# Patient Record
Sex: Male | Born: 1965 | Race: Black or African American | Hispanic: No | Marital: Married | Smoking: Former smoker
Health system: Southern US, Community
[De-identification: ages and names within clinical notes are randomized; demographics above are authoritative.]

## PROBLEM LIST (undated history)

## (undated) DIAGNOSIS — T7840XA Allergy, unspecified, initial encounter: Secondary | ICD-10-CM

## (undated) HISTORY — DX: Allergy, unspecified, initial encounter: T78.40XA

---

## 2017-06-05 ENCOUNTER — Emergency Department (HOSPITAL_COMMUNITY): Payer: Managed Care, Other (non HMO)

## 2017-06-05 ENCOUNTER — Encounter (HOSPITAL_COMMUNITY): Payer: Self-pay

## 2017-06-05 ENCOUNTER — Emergency Department (HOSPITAL_COMMUNITY)
Admission: EM | Admit: 2017-06-05 | Discharge: 2017-06-05 | Disposition: A | Discharge status: Home / Self Care | Payer: Managed Care, Other (non HMO) | Attending: Emergency Medicine | Admitting: Emergency Medicine

## 2017-06-05 DIAGNOSIS — F17210 Nicotine dependence, cigarettes, uncomplicated: Secondary | ICD-10-CM | POA: Diagnosis not present

## 2017-06-05 DIAGNOSIS — R1030 Lower abdominal pain, unspecified: Secondary | ICD-10-CM | POA: Insufficient documentation

## 2017-06-05 LAB — URINALYSIS, ROUTINE W REFLEX MICROSCOPIC
Bilirubin Urine: NEGATIVE
Glucose, UA: NEGATIVE mg/dL
HGB URINE DIPSTICK: NEGATIVE
KETONES UR: NEGATIVE mg/dL
LEUKOCYTES UA: NEGATIVE
Nitrite: NEGATIVE
Protein, ur: NEGATIVE mg/dL
Specific Gravity, Urine: 1.014 (ref 1.005–1.030)
pH: 6 (ref 5.0–8.0)

## 2017-06-05 LAB — COMPREHENSIVE METABOLIC PANEL
ALBUMIN: 4 g/dL (ref 3.5–5.0)
ALT: 14 U/L — AB (ref 17–63)
AST: 17 U/L (ref 15–41)
Alkaline Phosphatase: 72 U/L (ref 38–126)
Anion gap: 8 (ref 5–15)
BUN: 7 mg/dL (ref 6–20)
CHLORIDE: 105 mmol/L (ref 101–111)
CO2: 24 mmol/L (ref 22–32)
Calcium: 8.8 mg/dL — ABNORMAL LOW (ref 8.9–10.3)
Creatinine, Ser: 1 mg/dL (ref 0.61–1.24)
GFR calc Af Amer: >60 mL/min (ref >60)
Glucose, Bld: 89 mg/dL (ref 65–99)
POTASSIUM: 3.1 mmol/L — AB (ref 3.5–5.1)
Sodium: 137 mmol/L (ref 135–145)
Total Bilirubin: 0.8 mg/dL (ref 0.3–1.2)
Total Protein: 6.9 g/dL (ref 6.5–8.1)

## 2017-06-05 LAB — CBC
HEMATOCRIT: 40.1 % (ref 39.0–52.0)
Hemoglobin: 13.2 g/dL (ref 13.0–17.0)
MCH: 29.7 pg (ref 26.0–34.0)
MCHC: 32.9 g/dL (ref 30.0–36.0)
MCV: 90.3 fL (ref 78.0–100.0)
Platelets: 270 10*3/uL (ref 150–400)
RBC: 4.44 MIL/uL (ref 4.22–5.81)
RDW: 14.4 % (ref 11.5–15.5)
WBC: 7.5 10*3/uL (ref 4.0–10.5)

## 2017-06-05 LAB — LIPASE, BLOOD: Lipase: 24 U/L (ref 11–51)

## 2017-06-05 MED ORDER — SODIUM CHLORIDE 0.9 % IV SOLN
Freq: Once | INTRAVENOUS | Status: AC
  Administered 2017-06-05: 20:00:00 via INTRAVENOUS

## 2017-06-05 MED ORDER — HYDROCODONE-ACETAMINOPHEN 5-325 MG PO TABS
2.0000 | ORAL_TABLET | Freq: Once | ORAL | Status: AC
  Administered 2017-06-05: 2 via ORAL
  Filled 2017-06-05: qty 2

## 2017-06-05 MED ORDER — IBUPROFEN 400 MG PO TABS
600.0000 mg | ORAL_TABLET | Freq: Once | ORAL | Status: AC
  Administered 2017-06-05: 600 mg via ORAL
  Filled 2017-06-05: qty 1

## 2017-06-05 MED ORDER — MORPHINE SULFATE (PF) 4 MG/ML IV SOLN
4.0000 mg | Freq: Once | INTRAVENOUS | Status: AC
  Administered 2017-06-05: 4 mg via INTRAVENOUS
  Filled 2017-06-05: qty 1

## 2017-06-05 MED ORDER — CYCLOBENZAPRINE HCL 10 MG PO TABS: 10.0000 mg | ORAL_TABLET | Freq: Two times a day (BID) | ORAL | 0 refills | Status: DC | PRN

## 2017-06-05 MED ORDER — IBUPROFEN 600 MG PO TABS: 600.0000 mg | ORAL_TABLET | Freq: Four times a day (QID) | ORAL | 0 refills | Status: DC | PRN

## 2017-06-05 MED ORDER — HYDROCODONE-ACETAMINOPHEN 5-325 MG PO TABS: 1.0000 | ORAL_TABLET | ORAL | 0 refills | Status: DC | PRN

## 2017-06-05 MED ORDER — CYCLOBENZAPRINE HCL 10 MG PO TABS
10.0000 mg | ORAL_TABLET | Freq: Once | ORAL | Status: AC
  Administered 2017-06-05: 10 mg via ORAL
  Filled 2017-06-05: qty 1

## 2017-06-05 NOTE — ED Notes (Signed)
Pt A&OX4 at discharge. Pt wheeled out of ED via wheelchair by this RN. Pt verbalized understanding of d/c instructions, prescriptions and follow up care.

## 2017-06-05 NOTE — Discharge Instructions (Signed)
Return to the emergency department if he develops new or changing symptoms.

## 2017-06-05 NOTE — ED Triage Notes (Signed)
Per Pt, Pt is coming from home with complaints of lower abdominal pain that started this morning along with groin pain. Pt denies any burning with urination or blood in urine. Denies N/V/D.

## 2017-06-05 NOTE — ED Provider Notes (Signed)
[facility]-EMERGENCY DEPT Provider Note   CSN: [id number] Arrival date & time: 06/05/17  1817     History   Chief Complaint Chief Complaint  Patient presents with  . Abdominal Pain  . Groin Pain    HPI Anthony Chan is a 51 y.o. male.  HPI Patient ports he started with left lower abdominal pain and flank pain today. It was sudden in onset. He reports that radiates to his penis and testicles. He denies fever, chills, nausea or vomiting. He denies history of similar pain. He does report urgency with urination. History reviewed. No pertinent past medical history.  There are no active problems to display for this patient.   History reviewed. No pertinent surgical history.     Home Medications    Prior to Admission medications   Medication Sig Start Date End Date Taking? Authorizing Provider  Multiple Vitamin (MULTIVITAMIN WITH MINERALS) TABS tablet Take 1 tablet by mouth daily.   Yes [provider]  vitamin C (ASCORBIC ACID) 500 MG tablet Take 500 mg by mouth daily.   Yes [provider]  cyclobenzaprine (FLEXERIL) 10 MG tablet Take 1 tablet (10 mg total) by mouth 2 (two) times daily as needed for muscle spasms. 06/05/17   Arby BarrettePfeiffer, Emeric Novinger, MD  HYDROcodone-acetaminophen (NORCO/VICODIN) 5-325 MG tablet Take 1-2 tablets by mouth every 4 (four) hours as needed for moderate pain or severe pain. 06/05/17   Arby BarrettePfeiffer, Viola Kinnick, MD  ibuprofen (ADVIL,MOTRIN) 600 MG tablet Take 1 tablet (600 mg total) by mouth every 6 (six) hours as needed. 06/05/17   Arby BarrettePfeiffer, Tanajah Boulter, MD    Family History No family history on file.  Social History Social History  Substance Use Topics  . Smoking status: Current Every Day Smoker    Packs/day: 0.50    Types: Cigarettes  . Smokeless tobacco: Never Used  . Alcohol use Yes     Allergies   Other   Review of Systems Review of Systems 10 Systems reviewed and are negative for acute change except as noted in the HPI.   Physical Exam Updated  Vital Signs BP 138/81   Pulse (!) 51   Temp 98.3 F (36.8 C) (Oral)   Resp 16   Ht 6\' 1"  (1.854 m)   Wt 102.1 kg (225 lb)   SpO2 99%   BMI 29.69 kg/m   Physical Exam  Constitutional: He appears well-developed and well-nourished.  HENT:  Head: Normocephalic and atraumatic.  Eyes: Conjunctivae are normal.  Neck: Neck supple.  Cardiovascular: Normal rate and regular rhythm.   No murmur heard. Pulmonary/Chest: Effort normal and breath sounds normal. No respiratory distress.  Abdominal: Soft. There is tenderness.  Right lower quadrant tenderness to palpation. No guarding no mass.  Genitourinary:  Genitourinary Comments: Testicles nontender. No scrotal swelling. No mass in the inguinal canal. Penis normal.  Musculoskeletal: Normal range of motion. He exhibits no edema.  Neurological: He is alert.  Skin: Skin is warm and dry.  Psychiatric: He has a normal mood and affect.  Nursing note and vitals reviewed.    ED Treatments / Results  Labs (all labs ordered are listed, but only abnormal results are displayed) Labs Reviewed  COMPREHENSIVE METABOLIC PANEL - Abnormal; Notable for the following:       Result Value   Potassium 3.1 (*)    Calcium 8.8 (*)    ALT 14 (*)    All other components within normal limits  LIPASE, BLOOD  CBC  URINALYSIS, ROUTINE W REFLEX MICROSCOPIC  EKG  EKG Interpretation None       Radiology Ct Renal Stone Study  Result Date: 06/05/2017 CLINICAL DATA:  Initial evaluation for acute left flank pain. EXAM: CT ABDOMEN AND PELVIS WITHOUT CONTRAST TECHNIQUE: Multidetector CT imaging of the abdomen and pelvis was performed following the standard protocol without IV contrast. COMPARISON:  None. FINDINGS: Lower chest: Minimal subsegmental atelectasis present at the left lung base. Visualized lung bases are otherwise clear. Hepatobiliary: Limited noncontrast evaluation liver is unremarkable. Gallbladder within normal limits. No biliary dilatation.  Pancreas: Pancreas within normal limits. Spleen: Spleen within normal limits. Adrenals/Urinary Tract: Adrenal glands are normal. Kidneys equal size without evidence for nephrolithiasis or hydronephrosis. No radiopaque calculi seen along the course of either renal collecting system. No hydroureter. Bladder within normal limits. No layering stones within the bladder lumen. Stomach/Bowel: Stomach within normal limits. No evidence for bowel obstruction. Appendix normal. No acute inflammatory changes seen about the bowels. Vascular/Lymphatic: Intra- abdominal aorta of normal caliber. No adenopathy. Reproductive: Prostate normal. Other: No free air or fluid. Small fat containing paraumbilical hernia noted. Musculoskeletal: No acute osseous abnormality. No worrisome lytic or blastic osseous lesions. Degenerative disc bulge noted at L4-5. IMPRESSION: 1. No CT evidence for nephrolithiasis or obstructive uropathy. No ureterolithiasis. 2. No other acute intra-abdominal or pelvic process identified. Electronically Signed   By: Rise Mu M.D.   On: 06/05/2017 21:34    Procedures Procedures (including critical care time)  Medications Ordered in ED Medications  morphine 4 MG/ML injection 4 mg (4 mg Intravenous Given 06/05/17 2017)  0.9 %  sodium chloride infusion ( Intravenous Stopped 06/05/17 2327)  HYDROcodone-acetaminophen (NORCO/VICODIN) 5-325 MG per tablet 2 tablet (2 tablets Oral Given 06/05/17 2316)  ibuprofen (ADVIL,MOTRIN) tablet 600 mg (600 mg Oral Given 06/05/17 2317)  cyclobenzaprine (FLEXERIL) tablet 10 mg (10 mg Oral Given 06/05/17 2317)     Initial Impression / Assessment and Plan / ED Course  I have reviewed the triage vital signs and the nursing notes.  Pertinent labs & imaging results that were available during my care of the patient were reviewed by me and considered in my medical decision making (see chart for details).     Patient identifies that pain is mostly elicited by movement of  the lower abdomen. He reports he did have a heavy box handed to him at work which he immediately dropped because he felt it straining and pulling and got.pain is lower abdomen.  Final Clinical Impressions(s) / ED Diagnoses   Final diagnoses:  Lower abdominal pain   CT is negative for stone or other acute pathology. Physical exam does not show hernia or abnormal genital exam. Patient does not have tenderness to testicular exam. At this time, with CT and urinalysis negative as well as patient's history now of having had a likely strain at work, I more suspect abdominal wall strain. Patient is clinically well and appearance. Plan will be to treat for pain and return precautions are reviewed. New Prescriptions New Prescriptions   CYCLOBENZAPRINE (FLEXERIL) 10 MG TABLET    Take 1 tablet (10 mg total) by mouth 2 (two) times daily as needed for muscle spasms.   HYDROCODONE-ACETAMINOPHEN (NORCO/VICODIN) 5-325 MG TABLET    Take 1-2 tablets by mouth every 4 (four) hours as needed for moderate pain or severe pain.   IBUPROFEN (ADVIL,MOTRIN) 600 MG TABLET    Take 1 tablet (600 mg total) by mouth every 6 (six) hours as needed.     Arby Barrette, MD 06/05/17 [phone]

## 2018-09-15 IMAGING — CT CT RENAL STONE PROTOCOL
2 of 4 series · 16 of 46 positions shown, 18 images · non-contrast
Comparison: None.

CLINICAL DATA: Initial evaluation for acute left flank pain.

EXAM:
CT ABDOMEN AND PELVIS WITHOUT CONTRAST
TECHNIQUE: Multidetector CT imaging of the abdomen and pelvis was performed
following the standard protocol without IV contrast.

[Series 3: renal stone 5.0 · axial · 0.88mm/px · z∈[+1030,+1470]mm · 13 of 96 slices shown, 15 images]
[im 4/96  soft-tissue]
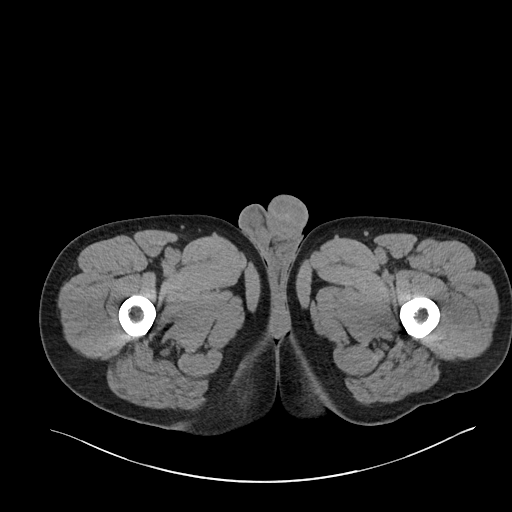
[im 4/96  bone]
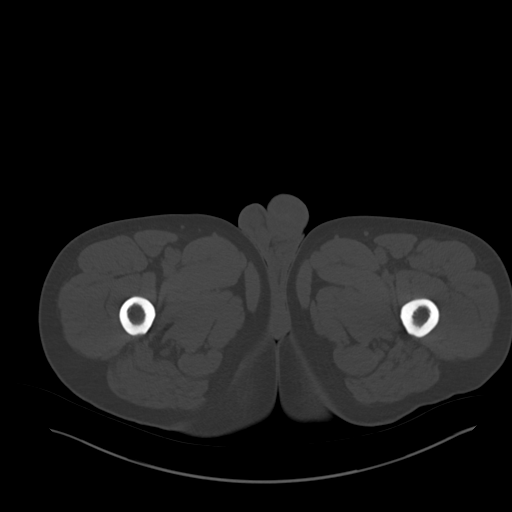
[im 12/96  soft-tissue]
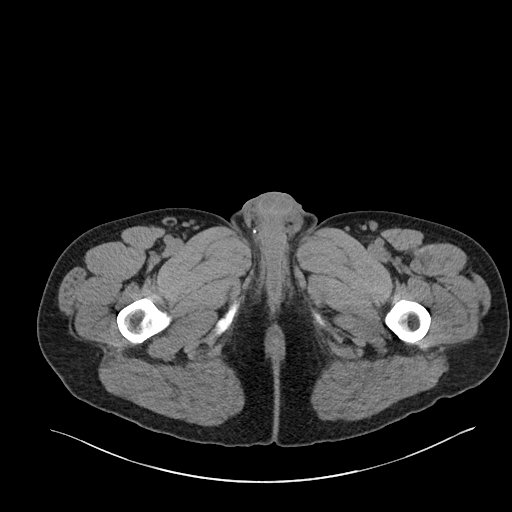
[im 20/96  soft-tissue]
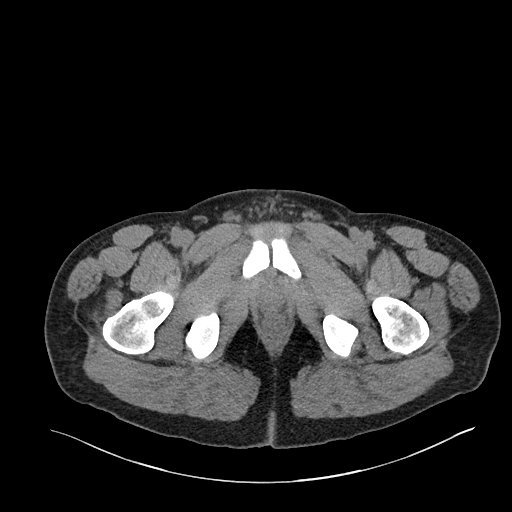
[im 27/96  soft-tissue]
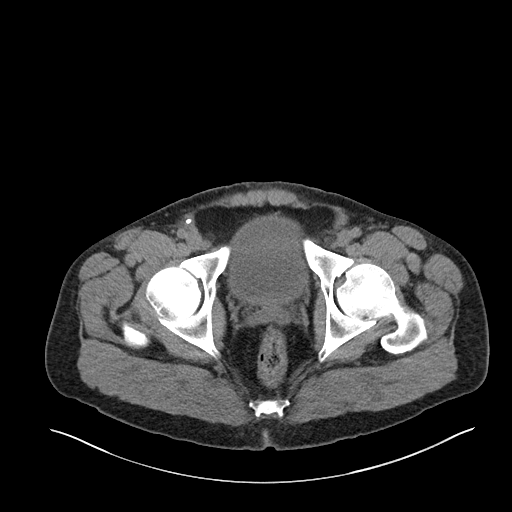
[im 35/96  soft-tissue]
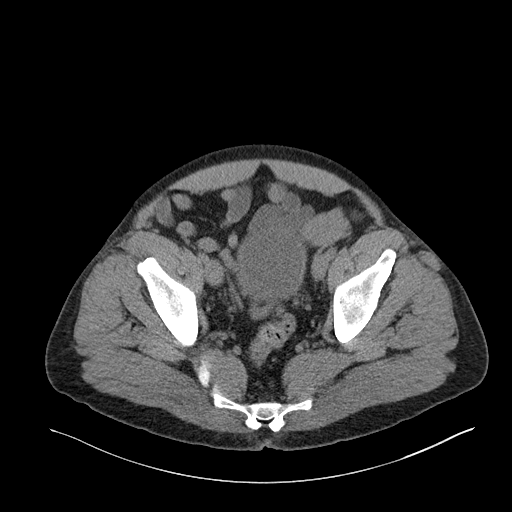
[im 42/96  soft-tissue]
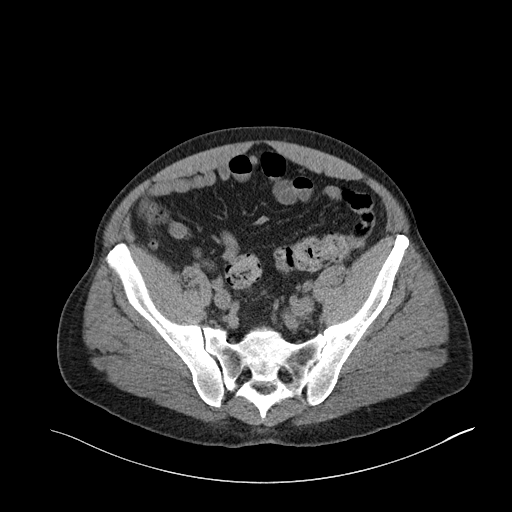
[im 50/96  soft-tissue]
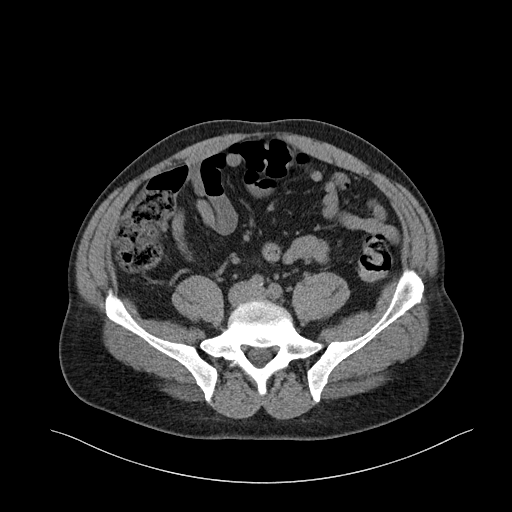
[im 54/96  soft-tissue]
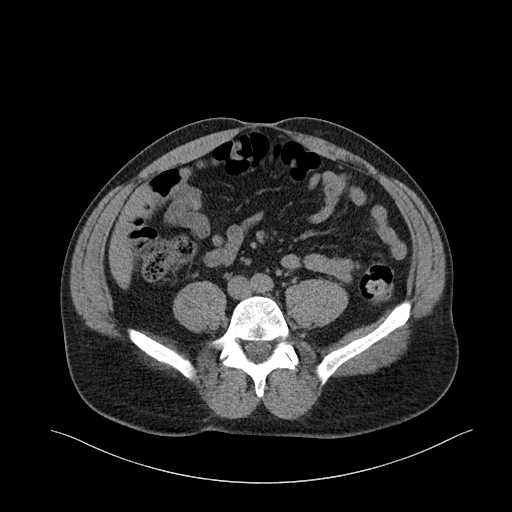
[im 61/96  soft-tissue]
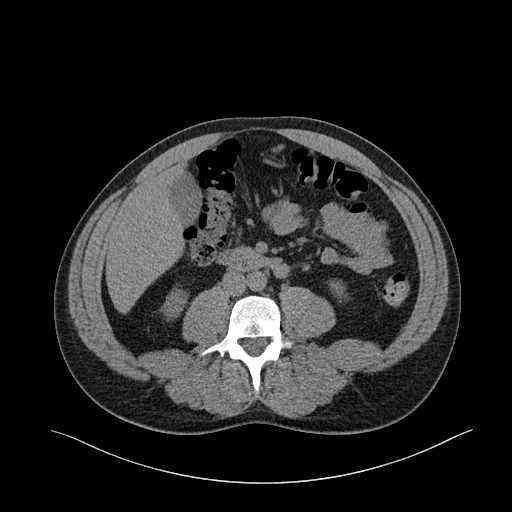
[im 61/96  bone]
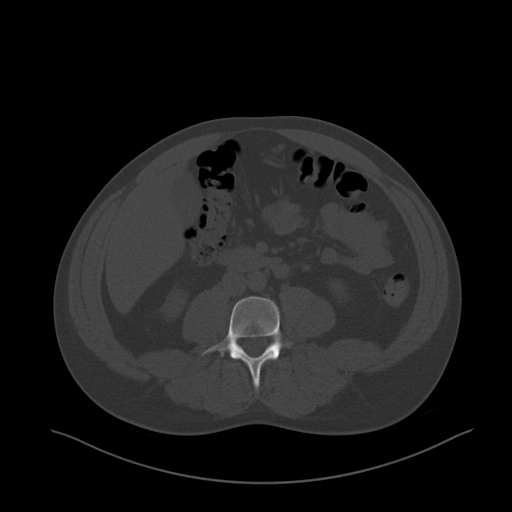
[im 69/96  soft-tissue]
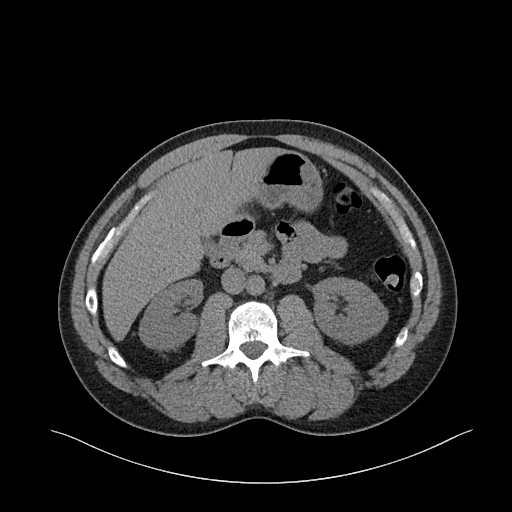
[im 77/96  soft-tissue]
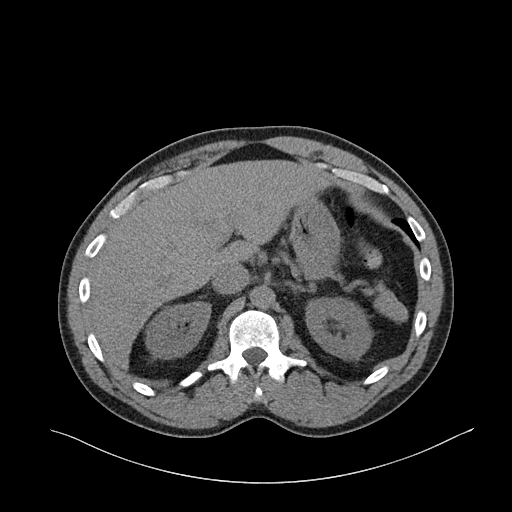
[im 84/96  soft-tissue]
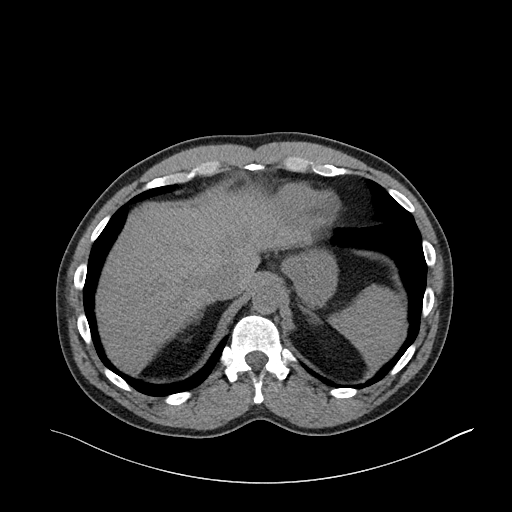
[im 92/96  soft-tissue]
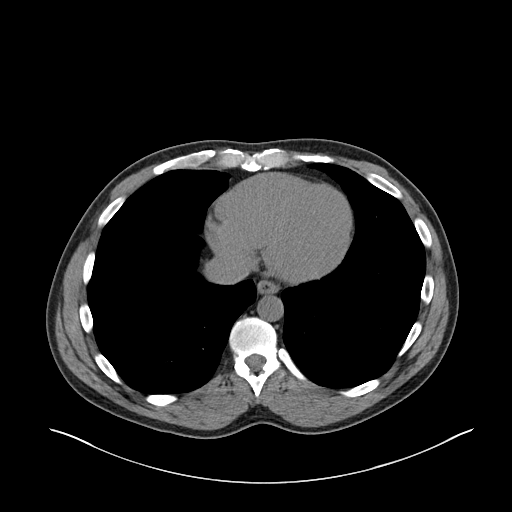

[Series 6: renal stone 3.0 cor · coronal · 0.93mm/px · 3 of 97 slices shown]
[im 33/97  soft-tissue]
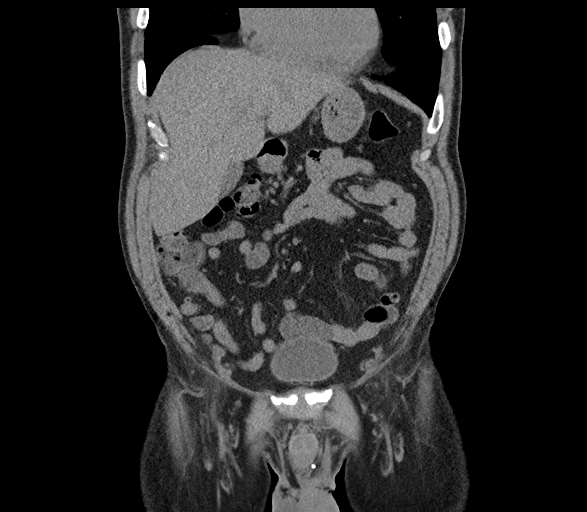
[im 43/97  soft-tissue]
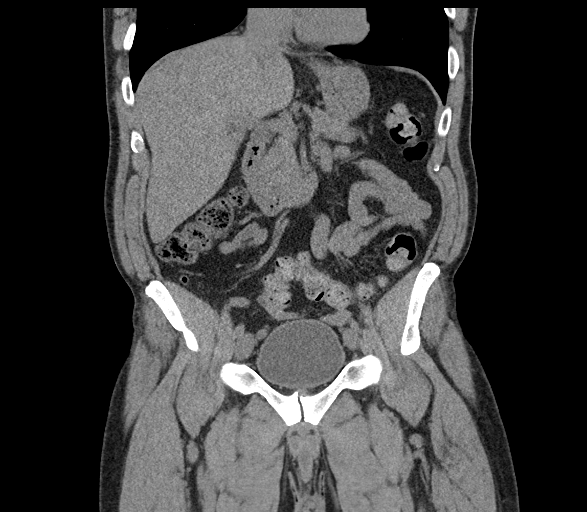
[im 54/97  soft-tissue]
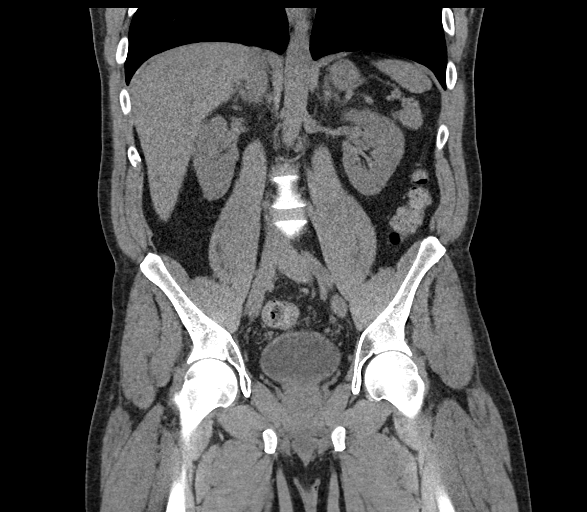

[16 of 46 positions shown; findings below may reference images not displayed]

FINDINGS: Lower chest: Minimal subsegmental atelectasis present at the left
lung base. Visualized lung bases are otherwise clear.

Hepatobiliary: Limited noncontrast evaluation liver is unremarkable.
Gallbladder within normal limits. No biliary dilatation.

Pancreas: Pancreas within normal limits.

Spleen: Spleen within normal limits.

Adrenals/Urinary Tract: Adrenal glands are normal. Kidneys equal
size without evidence for nephrolithiasis or hydronephrosis. No
radiopaque calculi seen along the course of either renal collecting
system. No hydroureter. Bladder within normal limits. No layering
stones within the bladder lumen.

Stomach/Bowel: Stomach within normal limits. No evidence for bowel
obstruction. Appendix normal. No acute inflammatory changes seen
about the bowels.

Vascular/Lymphatic: Intra- abdominal aorta of normal caliber. No
adenopathy.

Reproductive: Prostate normal.

Other: No free air or fluid. Small fat containing paraumbilical
hernia noted.

Musculoskeletal: No acute osseous abnormality. No worrisome lytic or
blastic osseous lesions. Degenerative disc bulge noted at L4-5.
IMPRESSION: 1. No CT evidence for nephrolithiasis or obstructive uropathy. No
ureterolithiasis.
2. No other acute intra-abdominal or pelvic process identified.

## 2019-02-11 ENCOUNTER — Encounter: Payer: Self-pay | Admitting: Gastroenterology

## 2019-03-18 ENCOUNTER — Encounter: Payer: Managed Care, Other (non HMO) | Admitting: Gastroenterology

## 2019-04-19 ENCOUNTER — Ambulatory Visit (AMBULATORY_SURGERY_CENTER): Payer: Self-pay | Admitting: *Deleted

## 2019-04-19 ENCOUNTER — Other Ambulatory Visit: Payer: Self-pay

## 2019-04-19 VITALS — Ht 74.0 in | Wt 209.0 lb

## 2019-04-19 DIAGNOSIS — Z1211 Encounter for screening for malignant neoplasm of colon: Secondary | ICD-10-CM

## 2019-04-19 MED ORDER — PEG 3350-KCL-NA BICARB-NACL 420 G PO SOLR: 4000.0000 mL | Freq: Once | ORAL | 0 refills | Status: AC

## 2019-04-19 NOTE — Progress Notes (Signed)
Denies allergies to eggs or soy products. Denies complications with sedation or anesthesia. Denies O2 use. Denies use of diet or weight loss medications.  Emmi instructions given for colonoscopy.  

## 2019-05-11 ENCOUNTER — Encounter: Payer: Self-pay | Admitting: Gastroenterology

## 2019-05-19 ENCOUNTER — Encounter: Payer: Managed Care, Other (non HMO) | Admitting: Gastroenterology

## 2019-06-14 ENCOUNTER — Encounter: Payer: Self-pay | Admitting: Gastroenterology

## 2019-07-08 ENCOUNTER — Other Ambulatory Visit: Payer: Self-pay

## 2019-07-08 ENCOUNTER — Ambulatory Visit (AMBULATORY_SURGERY_CENTER): Payer: Self-pay

## 2019-07-08 VITALS — Ht 72.0 in | Wt 209.0 lb

## 2019-07-08 DIAGNOSIS — Z1211 Encounter for screening for malignant neoplasm of colon: Secondary | ICD-10-CM

## 2019-07-08 MED ORDER — PEG 3350-KCL-NA BICARB-NACL 420 G PO SOLR: 4000.0000 mL | Freq: Once | ORAL | 0 refills | Status: AC

## 2019-07-08 NOTE — Progress Notes (Signed)
Denies allergies to eggs or soy products. Denies complication of anesthesia or sedation. Denies use of weight loss medication. Denies use of O2.   Emmi instructions given for colonoscopy.  Pre-Visit was conducted by phone due to Covid 19. Instructions were reviewed and mailed to patients confirmed home address. Patient was encouraged to call if he had any questions regarding his instructions.   Patient required extra time to review instructions. I am not sure that the patient was fully listening.

## 2019-07-21 ENCOUNTER — Telehealth: Payer: Self-pay

## 2019-07-21 NOTE — Telephone Encounter (Signed)
Called for covid screening questions. Patient requested to have procedure for tomorrow cancelled. He states he does not have health insurance. Dr. Rush Landmark would you like to charge for late cancellation?

## 2019-07-22 ENCOUNTER — Encounter: Payer: Managed Care, Other (non HMO) | Admitting: Gastroenterology

## 2019-07-22 NOTE — Telephone Encounter (Signed)
Understanding that patient does not have insurance, likely will be difficult for him to pay late fee. Please let the patient know that we are happy to try and assist him if possible and he should talk with our staff to see if there is anything that we can do to try and help him in this time.  He needs to have colon cancer screening and so ideally we would want him to work with [place] to see if he can get this done but if he has to wait till he gets insurance and please let he and his primary care provider know we will be available as soon as he is willing to move forward. No latency to be assessed.

## 2022-08-07 ENCOUNTER — Encounter (HOSPITAL_BASED_OUTPATIENT_CLINIC_OR_DEPARTMENT_OTHER): Payer: Self-pay | Admitting: Emergency Medicine

## 2022-08-07 ENCOUNTER — Emergency Department (HOSPITAL_BASED_OUTPATIENT_CLINIC_OR_DEPARTMENT_OTHER)
Admission: EM | Admit: 2022-08-07 | Discharge: 2022-08-07 | Disposition: A | Discharge status: Home / Self Care | Payer: No Typology Code available for payment source | Attending: Emergency Medicine | Admitting: Emergency Medicine

## 2022-08-07 ENCOUNTER — Emergency Department (HOSPITAL_BASED_OUTPATIENT_CLINIC_OR_DEPARTMENT_OTHER): Payer: No Typology Code available for payment source

## 2022-08-07 ENCOUNTER — Other Ambulatory Visit: Payer: Self-pay

## 2022-08-07 DIAGNOSIS — Y92410 Unspecified street and highway as the place of occurrence of the external cause: Secondary | ICD-10-CM | POA: Diagnosis not present

## 2022-08-07 DIAGNOSIS — M5442 Lumbago with sciatica, left side: Secondary | ICD-10-CM | POA: Insufficient documentation

## 2022-08-07 DIAGNOSIS — M545 Low back pain, unspecified: Secondary | ICD-10-CM | POA: Diagnosis present

## 2022-08-07 MED ORDER — LIDOCAINE 5 % EX PTCH
1.0000 | MEDICATED_PATCH | CUTANEOUS | Status: DC
  Administered 2022-08-07: 1 via TRANSDERMAL
  Filled 2022-08-07: qty 1

## 2022-08-07 MED ORDER — OXYCODONE HCL 5 MG PO TABS: 5.0000 mg | ORAL_TABLET | ORAL | 0 refills | Status: DC | PRN

## 2022-08-07 MED ORDER — LIDOCAINE 5 % EX PTCH: 1.0000 | MEDICATED_PATCH | CUTANEOUS | 0 refills | Status: AC

## 2022-08-07 MED ORDER — PREDNISONE 20 MG PO TABS: 20.0000 mg | ORAL_TABLET | Freq: Every day | ORAL | 0 refills | Status: DC

## 2022-08-07 MED ORDER — OXYCODONE-ACETAMINOPHEN 5-325 MG PO TABS
1.0000 | ORAL_TABLET | Freq: Once | ORAL | Status: AC
  Administered 2022-08-07: 1 via ORAL
  Filled 2022-08-07: qty 1

## 2022-08-07 MED ORDER — METHOCARBAMOL 500 MG PO TABS: 500.0000 mg | ORAL_TABLET | Freq: Two times a day (BID) | ORAL | 0 refills | Status: AC
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
## 2022-08-07 NOTE — Discharge Instructions (Addendum)
Take the meds as prescribed  Return for new or worsening symptoms

## 2022-10-01 ENCOUNTER — Emergency Department (HOSPITAL_COMMUNITY)
Admission: EM | Admit: 2022-10-01 | Discharge: 2022-10-01 | Disposition: A | Discharge status: Home / Self Care | Payer: Self-pay | Attending: Emergency Medicine | Admitting: Emergency Medicine

## 2022-10-01 ENCOUNTER — Other Ambulatory Visit: Payer: Self-pay

## 2022-10-01 ENCOUNTER — Encounter (HOSPITAL_COMMUNITY): Payer: Self-pay | Admitting: Emergency Medicine

## 2022-10-01 ENCOUNTER — Emergency Department (HOSPITAL_COMMUNITY): Payer: Self-pay

## 2022-10-01 DIAGNOSIS — M545 Low back pain, unspecified: Secondary | ICD-10-CM | POA: Insufficient documentation

## 2022-10-01 DIAGNOSIS — W1830XA Fall on same level, unspecified, initial encounter: Secondary | ICD-10-CM | POA: Insufficient documentation

## 2022-10-01 DIAGNOSIS — G8929 Other chronic pain: Secondary | ICD-10-CM | POA: Insufficient documentation

## 2022-10-01 DIAGNOSIS — W19XXXA Unspecified fall, initial encounter: Secondary | ICD-10-CM

## 2022-10-01 DIAGNOSIS — Z87891 Personal history of nicotine dependence: Secondary | ICD-10-CM | POA: Insufficient documentation

## 2022-10-01 DIAGNOSIS — M25551 Pain in right hip: Secondary | ICD-10-CM | POA: Insufficient documentation

## 2022-10-01 MED ORDER — IBUPROFEN 600 MG PO TABS: 600.0000 mg | ORAL_TABLET | Freq: Four times a day (QID) | ORAL | 0 refills | Status: AC | PRN

## 2022-10-01 MED ORDER — PREDNISONE 20 MG PO TABS
60.0000 mg | ORAL_TABLET | Freq: Once | ORAL | Status: AC
  Administered 2022-10-01: 60 mg via ORAL
  Filled 2022-10-01: qty 3

## 2022-10-01 MED ORDER — PREDNISONE 10 MG (21) PO TBPK: ORAL_TABLET | Freq: Every day | ORAL | 0 refills | Status: AC

## 2022-10-01 MED ORDER — OXYCODONE-ACETAMINOPHEN 5-325 MG PO TABS
1.0000 | ORAL_TABLET | Freq: Once | ORAL | Status: AC
  Administered 2022-10-01: 1 via ORAL
  Filled 2022-10-01: qty 1

## 2022-10-01 MED ORDER — HYDROCODONE-ACETAMINOPHEN 5-325 MG PO TABS
1.0000 | ORAL_TABLET | Freq: Once | ORAL | Status: AC
  Administered 2022-10-01: 1 via ORAL
  Filled 2022-10-01: qty 1

## 2022-10-01 MED ORDER — OXYCODONE HCL 5 MG PO TABS: 5.0000 mg | ORAL_TABLET | ORAL | 0 refills | Status: AC | PRN

## 2022-10-01 NOTE — ED Notes (Signed)
Pt assisted into standing position with moderate assistance. Walker provided by social work too short for patient when set to max height. Social work/case management made are. Pt provided crutches and able to walk with increased success while using these mobility aids.

## 2022-10-01 NOTE — ED Notes (Signed)
Patient transported to CT 
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
## 2022-10-01 NOTE — ED Notes (Signed)
This RN spoke with case management in regards to providing patient with a walker.
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
## 2022-10-01 NOTE — Discharge Instructions (Signed)
Note the work-up today was over reassuring.  Due to evidence of fusion of 2 of your spinal vertebrae with osteoarthritis as well as bulging disc.  We will treat this with prednisone taper as discussed as well as NSAID in the form of ibuprofen.  Recommend taking pain medication as needed.  Follow-up neurosurgery recommended.  Please call the number as you are able.  Please not hesitate to return to emergency department if the worrisome signs and symptoms we discussed become apparent.

## 2022-12-23 ENCOUNTER — Encounter: Payer: Self-pay | Admitting: Physical Therapy

## 2022-12-23 ENCOUNTER — Other Ambulatory Visit: Payer: Self-pay

## 2022-12-23 ENCOUNTER — Ambulatory Visit: Payer: 59 | Attending: Physician Assistant | Admitting: Physical Therapy

## 2022-12-23 DIAGNOSIS — M5441 Lumbago with sciatica, right side: Secondary | ICD-10-CM | POA: Insufficient documentation

## 2022-12-23 DIAGNOSIS — M6281 Muscle weakness (generalized): Secondary | ICD-10-CM | POA: Insufficient documentation

## 2022-12-23 DIAGNOSIS — Z7409 Other reduced mobility: Secondary | ICD-10-CM | POA: Diagnosis not present

## 2022-12-23 DIAGNOSIS — M5459 Other low back pain: Secondary | ICD-10-CM
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
## 2022-12-26 ENCOUNTER — Encounter: Payer: Self-pay | Admitting: Physical Therapy

## 2022-12-26 ENCOUNTER — Ambulatory Visit: Payer: 59 | Admitting: Physical Therapy

## 2022-12-26 DIAGNOSIS — M5441 Lumbago with sciatica, right side: Secondary | ICD-10-CM | POA: Diagnosis not present

## 2022-12-26 DIAGNOSIS — M6281 Muscle weakness (generalized): Secondary | ICD-10-CM | POA: Diagnosis not present

## 2022-12-26 DIAGNOSIS — Z7409 Other reduced mobility: Secondary | ICD-10-CM | POA: Diagnosis not present

## 2022-12-26 DIAGNOSIS — M5459 Other low back pain: Secondary | ICD-10-CM

## 2022-12-26 NOTE — Therapy (Signed)
OUTPATIENT PHYSICAL THERAPY TREATMENT NOTE   Patient Name: Anthony Chan MRN: [id number] DOB:1966-07-10, 57 y.o., male Today's Date: 12/26/2022  PCP: None REFERRING PROVIDER: Micheline Maze PA-C   END OF SESSION:   PT End of Session - 12/26/22 0956     Visit Number 2    Date for PT Re-Evaluation 02/17/23    Authorization Type [employer]    PT Start Time 0955   Pt late   PT Stop Time 1030    PT Time Calculation (min) 35 min    Activity Tolerance Patient tolerated treatment well    Behavior During Therapy [facility] for tasks assessed/performed             Past Medical History:  Diagnosis Date   Allergy    History reviewed. No pertinent surgical history. There are no problems to display for this patient.   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    PAIN:  Are you having pain? Yes: NPRS scale: 5/10 Pain location: RT lumbar Pain description: Sore Aggravating factors: Walking Relieving factors: Laying on LT side better   OBJECTIVE: (objective measures completed at initial evaluation unless otherwise dated)  IAGNOSTIC FINDINGS:  IMPRESSION: 1. 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 Associated bilateral L5-S1 transitional assimilation joints  with near-complete right-sided fusion and anterior left-sided fusion and associated mild osteoarthritis. These can be a source of lower back pain and/or hip pain. 2. No acute fracture of the lumbar spine. 3. Mild-to-moderate degenerative disc changes at L4-5 with mild retrolisthesis and a broad-based posterior disc osteophyte complex. Disc minimally impresses on the descending right L5 nerve within the right lateral recess. 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     PATIENT SURVEYS:  Modified Oswestry 58% self perceived at severe disability    COGNITION: Overall cognitive status: Within functional limits for tasks assessed  SENSATION: Light touch: Impaired right LE     LUMBAR ROM:  Declines squat to reach to the floor secondary to fear of pain   AROM eval  Flexion Hands to knees only very painful  Extension 10  Right lateral flexion 20 painful  Left lateral flexion 25  Right rotation    Left rotation     (Blank rows = not tested)   TRUNK STRENGTH:  Decreased activation of transverse abdominus muscles; abdominals 4-/5; decreased activation of lumbar multifidi; trunk extensors 4-/5   LOWER EXTREMITY ROM:   painful with SKTC   LOWER EXTREMITY MMT:   Fearful with attempting bil heel raise (more weight on left) LUMBAR SPECIAL TESTS:  +slump on right    FUNCTIONAL TESTS:  Unable to rise from a very high mat table without UE assist (shifted left)  catch in back with rising; Able to roll to left side and then sidelying to sitting with low levels of pain GAIT: Assistive device utilized: Crutches one Comments: using crutch on right side (slow/guarded   TODAY'S TREATMENT:   Nustep L1 x 5 min PTA monitored for pain IFC in LT sidelying 15 min to RT lumbar, pillows bt legs MHP to RT hip SELF CARE: PTA educated pt on how to use crutch on the LT side vs RT. Pt did not understand why and was unsure he would continue.                                                                                                                                DATE: 1/23      PATIENT EDUCATION:  Education details: Educated patient on anatomy and physiology of current symptoms, prognosis, plan of care as well as initial self care strategies to promote recovery  Person educated: Patient Education method: Explanation Education comprehension: verbalized understanding   HOME EXERCISE PROGRAM: To be started   ASSESSMENT:   CLINICAL IMPRESSION: Pt was 25 minutes late so treatment time was limited. Due to pt moving slowly with crutch we chose to focus on pain management/reduction using IFC Estim. Pt felt this modality was helpful and reported a reduction in pain. Pt is getting used to using crutch on LT side but feels awkward for him. PTA gace pt education on why he would use the crutch on his LT side vs RT.   OBJECTIVE IMPAIRMENTS: decreased activity tolerance, decreased mobility, difficulty walking, decreased ROM, decreased strength, impaired perceived functional ability, and pain.    ACTIVITY LIMITATIONS: carrying, lifting, bending, sitting, standing, squatting, sleeping, bed mobility, bathing, toileting, dressing, hygiene/grooming, and locomotion level   PARTICIPATION LIMITATIONS: meal prep, cleaning, laundry, shopping, community activity, and occupation   PERSONAL FACTORS: Past/current experiences and Time since onset of injury/illness/exacerbation are also affecting patient's functional outcome.    REHAB POTENTIAL: Good   CLINICAL DECISION MAKING: moderate EVALUATION COMPLEXITY: Moderate     GOALS: Goals reviewed with patient? Yes   SHORT TERM GOALS: Target date:  01/20/2023     The patient will demonstrate knowledge of basic self care strategies and exercises to promote healing   Baseline: Goal status: INITIAL   2.  The patient will report a 30% improvement in pain levels with functional activities which are currently difficult  including sitting, bending Baseline:  Goal status: INITIAL   3.  The patient will have improved trunk flexor and extensor muscle strength to at least 4-/5 needed for  rising from the couch  Baseline:  Goal status: INITIAL   4.  The patient will be able to walk for 3-5 minutes (with crutch if needed) Baseline:  Goal status: INITIAL     LONG TERM GOALS: Target date: 02/17/2023     The patient will be independent in a safe self progression of a home exercise program to promote further recovery of function   Baseline:  Goal status: INITIAL   2.  The patient will report a 60% improvement in pain levels with functional activities which are currently difficult including sitting, rising and bending Baseline:  Goal status: INITIAL   3.  The patient will have improved trunk flexor and extensor muscle strength to at least 4+/5 needed for lifting light weight objects such as grocery bags  Baseline:  Goal status: INITIAL   4.  The patient will have improved gait stamina and speed needed to ambulate 600 feet in 6 minutes  Baseline:  Goal status: INITIAL   5.  Improved Oswestry score to 49% indicating improved function with less pain Baseline:  Goal status: INITIAL       PLAN:   PT FREQUENCY: 2x/week   PT DURATION: 8 weeks   PLANNED INTERVENTIONS: Therapeutic exercises, Therapeutic activity, Neuromuscular re-education, Gait training, Patient/Family education, Self Care, Joint mobilization, Aquatic Therapy, Dry Needling, Electrical stimulation, Cryotherapy, Moist heat, Taping, Traction, Ultrasound, Manual therapy, and Re-evaluation.   PLAN FOR NEXT SESSION: Begin HEP, pt probably not a candidate  for aquatic as he wears an ankle monitor he cannot take off.   Vonya Ohalloran, PTA 12/26/2022, 12:08 PM

## 2022-12-28 NOTE — Therapy (Unsigned)
OUTPATIENT PHYSICAL THERAPY TREATMENT NOTE   Patient Name: Anthony Chan MRN: [id number] DOB:29-Dec-1965, 57 y.o., male Today's Date: 12/29/2022  PCP: None REFERRING PROVIDER: Dyke Brackett PA-C   END OF SESSION:   PT End of Session - 12/29/22 0839     Visit Number 3    Date for PT Re-Evaluation 02/17/23    Authorization Type [employer]    PT Start Time 0840    PT Stop Time 0930    PT Time Calculation (min) 50 min    Activity Tolerance Patient tolerated treatment well    Behavior During Therapy [facility] for tasks assessed/performed              Past Medical History:  Diagnosis Date   Allergy    History reviewed. No pertinent surgical history. There are no problems to display for this patient.   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 I tried using the cane in my Lt hand but I just can't do it, it feels better in my RT. The thing we did for pain was helpful, made my muscles relax.  PAIN:  Are you having pain? Yes: NPRS scale: 5/10 Pain location: RT lumbar Pain description: Sore Aggravating factors: Walking Relieving factors: Laying on LT side better   OBJECTIVE: (objective measures completed at initial evaluation unless otherwise dated)  IAGNOSTIC FINDINGS:  IMPRESSION: 1. Transitional lumbosacral anatomy with partial  sacralization of L5. Associated bilateral L5-S1 transitional assimilation joints with near-complete right-sided fusion and anterior left-sided fusion and associated mild osteoarthritis. These can be a source of lower back pain and/or hip pain. 2. No acute fracture of the lumbar spine. 3. Mild-to-moderate degenerative disc changes at L4-5 with mild retrolisthesis and a broad-based posterior disc osteophyte complex. Disc minimally impresses on the descending right L5 nerve within the right lateral recess. 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     PATIENT SURVEYS:  Modified Oswestry 58% self perceived at severe disability    COGNITION: Overall cognitive status: Within functional limits  for tasks assessed                          SENSATION: Light touch: Impaired right LE     LUMBAR ROM:  Declines squat to reach to the floor secondary to fear of pain   AROM eval  Flexion Hands to knees only very painful  Extension 10  Right lateral flexion 20 painful  Left lateral flexion 25  Right rotation    Left rotation     (Blank rows = not tested)   TRUNK STRENGTH:  Decreased activation of transverse abdominus muscles; abdominals 4-/5; decreased activation of lumbar multifidi; trunk extensors 4-/5   LOWER EXTREMITY ROM:   painful with SKTC   LOWER EXTREMITY MMT:   Fearful with attempting bil heel raise (more weight on left) LUMBAR SPECIAL TESTS:  +slump on right    FUNCTIONAL TESTS:  Unable to rise from a very high mat table without UE assist (shifted left)  catch in back with rising; Able to roll to left side and then sidelying to sitting with low levels of pain GAIT: Assistive device utilized: Crutches one Comments: using crutch on right side (slow/guarded   TODAY'S TREATMENT:   12/29/22: Nustep L1 6 min with PTA present to discuss his current status. Initiated HEP: see below: [place], lower trunk rotation, breathing for relaxation, and TA contraction with the  breathing. PTA encouraged pt to really pause when he is exercising and make sure he is relaxing and not holding his muscles tight. Pt did much better job relaxing today than previous. IFC in LT sidelying 10 min to RT lumbar, pillows bt legs MHP to RT hip  12/26/22" Nustep L1 x 5 min PTA monitored for pain IFC in LT sidelying 15 min to RT lumbar, pillows bt legs MHP to RT hip SELF CARE: PTA educated pt on how to use crutch on the LT side vs RT. Pt did not understand why and was unsure he would continue.                                                                                                                               DATE: 1/23      PATIENT EDUCATION:  Education details: Educated patient on anatomy and physiology of current symptoms, prognosis, plan of care as well as initial self care strategies to promote recovery  Person educated: Patient Education method: Explanation Education comprehension: verbalized understanding   HOME EXERCISE PROGRAM: Access Code: [email or web address] URL: [email or web address] Date: 12/29/2022 Prepared by: Myrene Galas  Exercises - Supine Lower Trunk Rotation  - 2 x daily - 7 x weekly - 3 reps - Supine Single Knee to Chest Stretch  - 2 x daily - 7 x weekly - 3 reps - Supine Diaphragmatic Breathing  - 3 x daily - 7 x weekly - 60 hold - Supine Transversus Abdominis Bracing - Hands on Stomach  - 2 x daily -  7 x weekly - 10 reps   ASSESSMENT:   CLINICAL IMPRESSION: Pt arrives with moderate soreness in his RT back and quad. Pt feels most comfortable using his can in his RTUE. Pt was educated in initial HEP today focusing on muscle relaxation, decreasing fear avoidance and gentle trunk and core movements. Pt feels th eESTIm helps his back muscles relax.    OBJECTIVE IMPAIRMENTS: decreased activity tolerance, decreased mobility, difficulty walking, decreased ROM, decreased strength, impaired perceived functional ability, and pain.    ACTIVITY  LIMITATIONS: carrying, lifting, bending, sitting, standing, squatting, sleeping, bed mobility, bathing, toileting, dressing, hygiene/grooming, and locomotion level   PARTICIPATION LIMITATIONS: meal prep, cleaning, laundry, shopping, community activity, and occupation   PERSONAL FACTORS: Past/current experiences and Time since onset of injury/illness/exacerbation are also affecting patient's functional outcome.    REHAB POTENTIAL: Good   CLINICAL DECISION MAKING: moderate EVALUATION COMPLEXITY: Moderate     GOALS: Goals reviewed with patient? Yes   SHORT TERM GOALS: Target date: 01/20/2023     The patient will demonstrate knowledge of basic self care strategies and exercises to promote healing   Baseline: Goal status: INITIAL   2.  The patient will report a 30% improvement in pain levels with functional activities which are currently difficult including sitting, bending Baseline:  Goal status: INITIAL   3.  The patient will have improved trunk flexor and extensor muscle strength to at least 4-/5 needed for  rising from the couch  Baseline:  Goal status: INITIAL   4.  The patient will be able to walk for 3-5 minutes (with crutch if needed) Baseline:  Goal status: INITIAL     LONG TERM GOALS: Target date: 02/17/2023     The patient will be independent in a safe self progression of a home exercise program to promote further recovery of function   Baseline:  Goal status: INITIAL   2.  The patient will report a 60% improvement in pain levels with functional activities which are currently difficult including sitting, rising and bending Baseline:  Goal status: INITIAL   3.  The patient will have improved trunk flexor and extensor muscle strength to at least 4+/5 needed for lifting light weight objects such as grocery bags  Baseline:  Goal status: INITIAL   4.  The patient will have improved gait stamina and speed needed to ambulate 600 feet in 6 minutes  Baseline:  Goal  status: INITIAL   5.  Improved Oswestry score to 49% indicating improved function with less pain Baseline:  Goal status: INITIAL       PLAN:   PT FREQUENCY: 2x/week   PT DURATION: 8 weeks   PLANNED INTERVENTIONS: Therapeutic exercises, Therapeutic activity, Neuromuscular re-education, Gait training, Patient/Family education, Self Care, Joint mobilization, Aquatic Therapy, Dry Needling, Electrical stimulation, Cryotherapy, Moist heat, Taping, Traction, Ultrasound, Manual therapy, and Re-evaluation.   PLAN FOR NEXT SESSION: Review HEP and progress where appropriate. Estim for pain control.  Libni Fusaro, PTA 12/29/2022, 9:11 AM

## 2022-12-29 ENCOUNTER — Encounter: Payer: Self-pay | Admitting: Physical Therapy

## 2022-12-29 ENCOUNTER — Ambulatory Visit: Payer: 59 | Admitting: Physical Therapy

## 2022-12-29 DIAGNOSIS — M6281 Muscle weakness (generalized): Secondary | ICD-10-CM | POA: Diagnosis not present

## 2022-12-29 DIAGNOSIS — M5441 Lumbago with sciatica, right side: Secondary | ICD-10-CM | POA: Diagnosis not present

## 2022-12-29 DIAGNOSIS — Z7409 Other reduced mobility: Secondary | ICD-10-CM | POA: Diagnosis not present

## 2022-12-29 DIAGNOSIS — M5459 Other low back pain: Secondary | ICD-10-CM

## 2023-01-01 ENCOUNTER — Ambulatory Visit: Payer: 59 | Attending: Physician Assistant | Admitting: Physical Therapy

## 2023-01-01 DIAGNOSIS — M5459 Other low back pain: Secondary | ICD-10-CM | POA: Insufficient documentation

## 2023-01-01 DIAGNOSIS — M6281 Muscle weakness (generalized): Secondary | ICD-10-CM | POA: Insufficient documentation

## 2023-01-01 NOTE — Therapy (Signed)
OUTPATIENT PHYSICAL THERAPY TREATMENT NOTE   Patient Name: Anthony Chan MRN: [id number] DOB:1966/02/26, 57 y.o., male Today's Date: 12/29/2022  PCP: None REFERRING PROVIDER: Micheline Maze PA-C   END OF SESSION:   PT End of Session - 12/29/22 0839     Visit Number 3    Date for PT Re-Evaluation 02/17/23    Authorization Type [employer]    PT Start Time 0840    PT Stop Time 0930    PT Time Calculation (min) 50 min    Activity Tolerance Patient tolerated treatment well    Behavior During Therapy [facility] for tasks assessed/performed              Past Medical History:  Diagnosis Date   Allergy    History reviewed. No pertinent surgical history. There are no problems to display for this patient.   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 Anthony Chan or first name pronounced like Anthony Chan' Seeking disability and may benefit from an FCE after course of PT  PRECAUTIONS: None  SUBJECTIVE:                                                                                                                                                                                      SUBJECTIVE STATEMENT:   Sore muscles especially in right side.  E-Stim is outstanding! The stretches were alright but I had fought through it.  Reports his sister is moving and he's been talking with Social Services about getting his own apartment.   PAIN:  Are you having pain? Yes: NPRS scale: 5/10 Pain location: RT lumbar Pain description: Sore Aggravating factors: Walking Relieving factors: Laying on LT side better   OBJECTIVE: (objective measures completed at initial evaluation unless otherwise dated)  IAGNOSTIC FINDINGS:  IMPRESSION: 1. 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 Associated bilateral L5-S1 transitional assimilation joints with near-complete right-sided fusion and anterior left-sided fusion and associated mild osteoarthritis. These can be a source of lower back pain and/or hip pain. 2. No acute fracture of the lumbar spine. 3. Mild-to-moderate degenerative disc changes at L4-5 with mild retrolisthesis and a broad-based posterior disc osteophyte complex. Disc minimally impresses on the descending right L5 nerve within the right lateral recess. 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     PATIENT SURVEYS:  Modified Oswestry 58% self perceived at severe disability    COGNITION: Overall cognitive status: Within functional limits for tasks assessed  SENSATION: Light touch: Impaired right LE     LUMBAR ROM:  Declines squat to reach to the floor secondary to fear of pain   AROM eval  Flexion Hands to knees only very painful  Extension 10  Right lateral flexion 20 painful  Left lateral flexion 25  Right rotation    Left rotation     (Blank rows = not tested)   TRUNK STRENGTH:  Decreased activation of transverse abdominus muscles; abdominals 4-/5; decreased activation of lumbar multifidi; trunk extensors 4-/5   LOWER EXTREMITY ROM:   painful with SKTC   LOWER EXTREMITY MMT:   Fearful with attempting bil heel raise (more weight on left) LUMBAR SPECIAL TESTS:  +slump on right    FUNCTIONAL TESTS:  Unable to rise from a very high mat table without UE assist (shifted left)  catch in back with rising; Able to roll to left side and then sidelying to sitting with low levels of pain GAIT: Assistive device utilized: Crutches one Comments: using crutch on right side (slow/guarded   TODAY'S TREATMENT:  01/01/23: Nustep L1 7 min Les only while discussing  current status  SKTC 5x right/left lower trunk rotation 10x  Bridge 10x Supine red band clams double  10x; single 10x right/left Seated with red band under feet with cue to sit up tall (modified dead lift) 10x  IFC 20v in LT sidelying 12 min to RT lumbar, pillows bt legs MHP to RT hip   12/29/22: Nustep L1 6 min with PTA present to discuss his current status. Initiated HEP: see below: [place], lower trunk rotation, breathing for relaxation, and TA contraction with the breathing. PTA encouraged pt to really pause when he is exercising and make sure he is relaxing and not holding his muscles tight. Pt did much better job relaxing today than previous. IFC in LT sidelying 10 min to RT lumbar, pillows bt legs MHP to RT hip  12/26/22" Nustep L1 x 5 min PTA monitored for pain IFC in LT sidelying 15 min to RT lumbar, pillows bt legs MHP to RT hip SELF CARE: PTA educated pt on how to use crutch on the LT side vs RT. Pt did not understand why and was unsure he would continue.                                                                                                                               DATE: 1/23      PATIENT EDUCATION:  Education details: Educated patient on anatomy and physiology of current symptoms, prognosis, plan of care as well as initial self care strategies to promote recovery  Person educated: Patient Education method: Explanation Education comprehension: verbalized understanding   HOME EXERCISE PROGRAM: Access Code: [email or web address] URL: [email or web address] Date: 12/29/2022 Prepared by: Anthony Chan  Exercises - Supine Lower Trunk Rotation  - 2 x daily - 7 x weekly - 3 reps - Supine Single Knee to Chest Stretch  - 2  x daily - 7 x weekly - 3 reps - Supine Diaphragmatic Breathing  - 3 x daily - 7 x weekly - 60 hold - Supine Transversus Abdominis Bracing - Hands on Stomach  - 2 x daily - 7 x weekly - 10 reps   ASSESSMENT:   CLINICAL IMPRESSION:  Able to progress intensity and challenge level slightly today with care taken to intentionally underdose exercise in  order to build therapeutic alliance.  Noted less guarding with movements than upon initial evaluation.  He reports excellent pain control with ES/heat.       OBJECTIVE IMPAIRMENTS: decreased activity tolerance, decreased mobility, difficulty walking, decreased ROM, decreased strength, impaired perceived functional ability, and pain.    ACTIVITY LIMITATIONS: carrying, lifting, bending, sitting, standing, squatting, sleeping, bed mobility, bathing, toileting, dressing, hygiene/grooming, and locomotion level   PARTICIPATION LIMITATIONS: meal prep, cleaning, laundry, shopping, community activity, and occupation   PERSONAL FACTORS: Past/current experiences and Time since onset of injury/illness/exacerbation are also affecting patient's functional outcome.    REHAB POTENTIAL: Good   CLINICAL DECISION MAKING: moderate EVALUATION COMPLEXITY: Moderate     GOALS: Goals reviewed with patient? Yes   SHORT TERM GOALS: Target date: 01/20/2023     The patient will demonstrate knowledge of basic self care strategies and exercises to promote healing   Baseline: Goal status: INITIAL   2.  The patient will report a 30% improvement in pain levels with functional activities which are currently difficult including sitting, bending Baseline:  Goal status: INITIAL   3.  The patient will have improved trunk flexor and extensor muscle strength to at least 4-/5 needed for  rising from the couch  Baseline:  Goal status: INITIAL   4.  The patient will be able to walk for 3-5 minutes (with crutch if needed) Baseline:  Goal status: INITIAL     LONG TERM GOALS: Target date: 02/17/2023     The patient will be independent in a safe self progression of a home exercise program to promote further recovery of function   Baseline:  Goal status: INITIAL   2.  The patient will report a 60% improvement in pain levels with functional activities which are currently difficult including sitting, rising and  bending Baseline:  Goal status: INITIAL   3.  The patient will have improved trunk flexor and extensor muscle strength to at least 4+/5 needed for lifting light weight objects such as grocery bags  Baseline:  Goal status: INITIAL   4.  The patient will have improved gait stamina and speed needed to ambulate 600 feet in 6 minutes  Baseline:  Goal status: INITIAL   5.  Improved Oswestry score to 49% indicating improved function with less pain Baseline:  Goal status: INITIAL       PLAN:   PT FREQUENCY: 2x/week   PT DURATION: 8 weeks   PLANNED INTERVENTIONS: Therapeutic exercises, Therapeutic activity, Neuromuscular re-education, Gait training, Patient/Family education, Self Care, Joint mobilization, Aquatic Therapy, Dry Needling, Electrical stimulation, Cryotherapy, Moist heat, Taping, Traction, Ultrasound, Manual therapy, and Re-evaluation.   PLAN FOR NEXT SESSION: graded exposure to exercise;  Estim for pain control.   Ruben Im, PT 01/01/23 8:37 AM Phone: [phone] Fax: [phone]

## 2023-01-04 NOTE — Therapy (Unsigned)
OUTPATIENT PHYSICAL THERAPY TREATMENT NOTE   Patient Name: Anthony Chan MRN: [id number] DOB:12/19/1965, 57 y.o., male Today's Date: 01/05/2023  PCP: None REFERRING PROVIDER: Micheline Maze PA-C   END OF SESSION:   PT End of Session - 01/05/23 1357     Visit Number 5    Date for PT Re-Evaluation 02/17/23    Authorization Type [employer]    PT Start Time 6811    PT Stop Time 1457    PT Time Calculation (min) 60 min    Activity Tolerance Patient tolerated treatment well    Behavior During Therapy [facility]FL for tasks assessed/performed               Past Medical History:  Diagnosis Date   Allergy    History reviewed. No pertinent surgical history. There are no problems to display for this patient.   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  PAIN:  Are you having pain? Yes: NPRS scale: 5/10 Pain location: RT lumbar Pain description: Sore Aggravating factors: Walking Relieving factors: Laying on LT side better   OBJECTIVE: (objective measures completed at initial evaluation unless otherwise dated)  IAGNOSTIC FINDINGS:  IMPRESSION: 1. 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 Associated bilateral L5-S1 transitional assimilation joints with near-complete  right-sided fusion and anterior left-sided fusion and associated mild osteoarthritis. These can be a source of lower back pain and/or hip pain. 2. No acute fracture of the lumbar spine. 3. Mild-to-moderate degenerative disc changes at L4-5 with mild retrolisthesis and a broad-based posterior disc osteophyte complex. Disc minimally impresses on the descending right L5 nerve within the right lateral recess. 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     PATIENT SURVEYS:  Modified Oswestry 58% self perceived at severe disability    COGNITION: Overall cognitive status: Within functional limits for tasks assessed  SENSATION: Light touch: Impaired right LE     LUMBAR ROM:  Declines squat to reach to the floor secondary to fear of pain   AROM eval  Flexion Hands to knees only very painful  Extension 10  Right lateral flexion 20 painful  Left lateral flexion 25  Right rotation    Left rotation     (Blank rows = not tested)   TRUNK STRENGTH:  Decreased activation of transverse abdominus muscles; abdominals 4-/5; decreased activation of lumbar multifidi; trunk extensors 4-/5   LOWER EXTREMITY ROM:   painful with SKTC   LOWER EXTREMITY MMT:   Fearful with attempting bil heel raise (more weight on left) LUMBAR SPECIAL TESTS:  +slump on right    FUNCTIONAL TESTS:  Unable to rise from a very high mat table without UE assist (shifted left)  catch in back with rising; Able to roll to left side and then sidelying to sitting with low levels of pain GAIT: Assistive device utilized: Crutches one Comments: using crutch on right side (slow/guarded   TODAY'S TREATMENT:   01/04/23: Nustep L1 x 8 min with PTA present to discuss current status and pt requested he watch the end of a training video he was watching.  Green band supine clamshell 10x Bil, then 10x single LTR to the LT 10x Supine bridge 10x Supine RTLE march/hip flexion 10x  with VC to engage  core/lower abs more Seated with red band under feet with cue to sit up tall (modified dead lift) 10x2 Sit to stand 3x from low mat table IFC 20v in LT sidelying 12 min to RT lumbar, pillows bt legs MHP to RT hip  01/01/23: Nustep L1 7 min Les only while discussing  current status  SKTC 5x right/left lower trunk rotation 10x  Bridge 10x Supine red band clams double 10x; single 10x right/left Seated with red band under feet with cue to sit up tall (modified dead lift) 10x  IFC 20v in LT sidelying 12 min to RT lumbar, pillows bt legs MHP to RT hip   12/29/22: Nustep L1 6 min with PTA present to discuss his current status. Initiated HEP: see below: [place], lower trunk rotation, breathing for relaxation, and TA contraction with the breathing. PTA encouraged pt to really pause when he is exercising and make sure he is relaxing and not holding his muscles tight. Pt did much better job relaxing today than previous. IFC in LT sidelying 10 min to RT lumbar, pillows bt legs MHP to RT hip    PATIENT EDUCATION:  Education details: Educated patient on anatomy and physiology of current symptoms, prognosis, plan of care as well as initial self care strategies to promote recovery  Person educated: Patient Education method: Explanation Education comprehension: verbalized understanding   HOME EXERCISE PROGRAM: Access Code: [email or web address] URL: [email or web address] Date: 12/29/2022 Prepared by: Anthony Chan  Exercises - Supine Lower Trunk Rotation  - 2 x daily - 7 x weekly - 3 reps - Supine Single Knee to Chest Stretch  - 2 x daily - 7 x weekly - 3 reps - Supine Diaphragmatic Breathing  - 3 x daily - 7 x weekly - 60 hold - Supine Transversus Abdominis Bracing - Hands on Stomach  - 2 x daily - 7 x weekly - 10 reps   ASSESSMENT:   CLINICAL IMPRESSION: Pt reports his Rt leg is doing better but continues to complain of back pain especially at night that affects his sleep. Pt tolerated the  green band vs red  well today but seems to struggle when he  uses his Rt hip (sit to stand, bridges) vs core stabilization in sitting type of exercises.      OBJECTIVE IMPAIRMENTS: decreased activity tolerance, decreased mobility, difficulty walking, decreased ROM, decreased strength, impaired perceived functional ability, and pain.    ACTIVITY LIMITATIONS: carrying, lifting, bending, sitting, standing, squatting, sleeping, bed mobility, bathing, toileting, dressing, hygiene/grooming, and locomotion level   PARTICIPATION LIMITATIONS: meal prep, cleaning, laundry, shopping, community activity, and occupation   PERSONAL FACTORS: Past/current experiences and Time since onset of injury/illness/exacerbation are also affecting patient's functional outcome.    REHAB POTENTIAL: Good   CLINICAL DECISION MAKING: moderate EVALUATION COMPLEXITY: Moderate     GOALS: Goals reviewed with patient? Yes   SHORT TERM GOALS: Target date: 01/20/2023     The patient will demonstrate knowledge of basic self care strategies and exercises to promote healing   Baseline: Goal status: INITIAL   2.  The patient will report a 30% improvement in pain levels with functional activities which are currently difficult including sitting, bending Baseline:  Goal status: INITIAL   3.  The patient will have improved trunk flexor and extensor muscle strength to at least 4-/5 needed for  rising from the couch  Baseline:  Goal status: INITIAL   4.  The patient will be able to walk for 3-5 minutes (with crutch if needed) Baseline:  Goal status: INITIAL     LONG TERM GOALS: Target date: 02/17/2023     The patient will be independent in a safe self progression of a home exercise program to promote further recovery of function   Baseline:  Goal status: INITIAL   2.  The patient will report a 60% improvement in pain levels with functional activities which are currently difficult including sitting, rising and  bending Baseline:  Goal status: INITIAL   3.  The patient will have improved trunk flexor and extensor muscle strength to at least 4+/5 needed for lifting light weight objects such as grocery bags  Baseline:  Goal status: INITIAL   4.  The patient will have improved gait stamina and speed needed to ambulate 600 feet in 6 minutes  Baseline:  Goal status: INITIAL   5.  Improved Oswestry score to 49% indicating improved function with less pain Baseline:  Goal status: INITIAL       PLAN:   PT FREQUENCY: 2x/week   PT DURATION: 8 weeks   PLANNED INTERVENTIONS: Therapeutic exercises, Therapeutic activity, Neuromuscular re-education, Gait training, Patient/Family education, Self Care, Joint mobilization, Aquatic Therapy, Dry Needling, Electrical stimulation, Cryotherapy, Moist heat, Taping, Traction, Ultrasound, Manual therapy, and Re-evaluation.   PLAN FOR NEXT SESSION: graded exposure to exercise;  Estim for pain control consider walk test to see how far pt can walk for  STG 4.   Anthony Chan, PTA 01/05/23 2:36 PM   Phone: [phone] Fax: [phone]

## 2023-01-05 ENCOUNTER — Ambulatory Visit: Payer: 59 | Admitting: Physical Therapy

## 2023-01-05 ENCOUNTER — Encounter: Payer: Self-pay | Admitting: Physical Therapy

## 2023-01-05 DIAGNOSIS — M5459 Other low back pain: Secondary | ICD-10-CM

## 2023-01-05 DIAGNOSIS — M6281 Muscle weakness (generalized): Secondary | ICD-10-CM

## 2023-01-07 ENCOUNTER — Ambulatory Visit: Payer: 59 | Admitting: Physical Therapy

## 2023-01-07 DIAGNOSIS — M6281 Muscle weakness (generalized): Secondary | ICD-10-CM

## 2023-01-07 DIAGNOSIS — M5459 Other low back pain: Secondary | ICD-10-CM | POA: Diagnosis not present

## 2023-01-07 NOTE — Therapy (Signed)
OUTPATIENT PHYSICAL THERAPY TREATMENT NOTE   Patient Name: Anthony Chan MRN: [id number] DOB:31-May-1966, 57 y.o., male Today's Date: 01/07/2023  PCP: None REFERRING PROVIDER: Micheline Maze PA-C   END OF SESSION:   PT End of Session - 01/07/23 0927     Visit Number 6    Date for PT Re-Evaluation 02/17/23    Authorization Type [employer]    PT Start Time 0930    PT Stop Time 1014    PT Time Calculation (min) 44 min    Activity Tolerance Patient tolerated treatment well               Past Medical History:  Diagnosis Date   Allergy    No past surgical history on file. There are no problems to display for this patient.   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 Anthony Chan or first name pronounced like Anthony Chan' Seeking disability and may benefit from an FCE after course of PT  PRECAUTIONS: None  SUBJECTIVE:                                                                                                                                                                                      SUBJECTIVE STATEMENT:   I'm stiff this morning.  I had to rollover to get off the couch.  PT is going good.  I can feel it when I get home.  I can tell it's helping.  I've been sitting at home and the only ex I got was taking the trash out.  Still using the crutch around the house.     PAIN:  Are you having pain? Yes: NPRS scale: 7/10 Pain location: RT lumbar Pain description: Sore Aggravating factors: Walking Relieving factors: Laying on LT side better   OBJECTIVE: (objective measures completed at initial evaluation unless otherwise dated)  IAGNOSTIC FINDINGS:  IMPRESSION: 1. 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 Associated bilateral L5-S1 transitional assimilation joints with near-complete right-sided fusion and anterior left-sided fusion and associated mild osteoarthritis. These can be a source of lower back pain and/or hip pain. 2. No acute fracture of the lumbar spine. 3. Mild-to-moderate degenerative disc changes at L4-5 with mild retrolisthesis and a broad-based posterior disc osteophyte complex. Disc minimally impresses on the descending right L5 nerve within the right lateral recess. 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     PATIENT SURVEYS:  Modified Oswestry 58% self perceived at severe  disability    COGNITION: Overall cognitive status: Within functional limits for tasks assessed                          SENSATION: Light touch: Impaired right LE     LUMBAR ROM:  Declines squat to reach to the floor secondary to fear of pain   AROM eval  Flexion Hands to knees only very painful  Extension 10  Right lateral flexion 20 painful  Left lateral flexion 25  Right rotation    Left rotation     (Blank rows = not tested)   TRUNK STRENGTH:  Decreased activation of transverse abdominus muscles; abdominals 4-/5; decreased activation of lumbar multifidi; trunk extensors 4-/5   LOWER EXTREMITY ROM:   painful with SKTC   LOWER EXTREMITY MMT:   Fearful with attempting bil heel raise (more weight on left) LUMBAR SPECIAL TESTS:  +slump on right    FUNCTIONAL TESTS:  Unable to rise from a very high mat table without UE assist (shifted left)  catch in back with rising; Able to roll to left side and then sidelying to sitting with low levels of pain GAIT: Assistive device utilized: Crutches one Comments: using crutch on right side (slow/guarded   TODAY'S TREATMENT:  01/07/23: Nustep L1 x 3 min, L3 4 min with PT present to discuss current status , with UE use to activate core muscle use  Green band supine clamshell 5x Bil, then 5x single Supine hand to opp knee push  to activate abdominals 5x Supine bridge 5x, green band across hips 5x Sidelying open books 5x right/left Sidelying green band clam 5x right/left Seated with green band under feet with cue to sit up tall (modified dead lift) 10x2 IFC 12 ma  in LT sidelying 10 min to RT lumbar, pillows bt legs MHP to RT hip  01/04/23: Nustep L1 x 8 min with PTA present to discuss current status and pt requested he watch the end of a training video he was watching.  Green band supine clamshell 10x Bil, then 10x single LTR to the LT 10x Supine bridge 10x Supine RTLE march/hip flexion 10x  with VC to engage core/lower abs more Seated with red band under feet with cue to sit up tall (modified dead lift) 10x2 Sit to stand 3x from low mat table IFC 20v in LT sidelying 12 min to RT lumbar, pillows bt legs MHP to RT hip  01/01/23: Nustep L1 7 min Les only while discussing  current status  SKTC 5x right/left lower trunk rotation 10x  Bridge 10x Supine red band clams double 10x; single 10x right/left Seated with red band under feet with cue to sit up tall (modified dead lift) 10x  IFC 20v in LT sidelying 12 min to RT lumbar, pillows bt legs MHP to RT hip   PATIENT EDUCATION:  Education details: Educated patient on anatomy and physiology of current symptoms, prognosis, plan of care as well as initial self care strategies to promote recovery  Person educated: Patient Education method: Explanation Education comprehension: verbalized understanding   HOME EXERCISE PROGRAM: Access Code: [email or web address] URL: [email or web address] Date: 12/29/2022 Prepared by: Myrene Galas  Exercises - Supine Lower Trunk Rotation  - 2 x daily - 7 x weekly - 3 reps - Supine Single Knee to Chest Stretch  - 2 x daily - 7 x weekly - 3 reps - Supine Diaphragmatic Breathing  - 3 x daily - 7 x weekly - 60 hold -  Supine Transversus Abdominis Bracing - Hands on Stomach  - 2 x daily - 7 x weekly - 10 reps   ASSESSMENT:    CLINICAL IMPRESSION:  Able to increase intensity level slightly today.  Right hip flexion and abduction ROM is much more difficult than left.  Verbal and tactile cues to activate core musculature and limit compensatory strategies.   He continues to have excellent pain relief from ES with moist heat.      OBJECTIVE IMPAIRMENTS: decreased activity tolerance, decreased mobility, difficulty walking, decreased ROM, decreased strength, impaired perceived functional ability, and pain.    ACTIVITY LIMITATIONS: carrying, lifting, bending, sitting, standing, squatting, sleeping, bed mobility, bathing, toileting, dressing, hygiene/grooming, and locomotion level   PARTICIPATION LIMITATIONS: meal prep, cleaning, laundry, shopping, community activity, and occupation   PERSONAL FACTORS: Past/current experiences and Time since onset of injury/illness/exacerbation are also affecting patient's functional outcome.    REHAB POTENTIAL: Good   CLINICAL DECISION MAKING: moderate EVALUATION COMPLEXITY: Moderate     GOALS: Goals reviewed with patient? Yes   SHORT TERM GOALS: Target date: 01/20/2023     The patient will demonstrate knowledge of basic self care strategies and exercises to promote healing   Baseline: Goal status: ongoing   2.  The patient will report a 30% improvement in pain levels with functional activities which are currently difficult including sitting, bending Baseline:  Goal status: ongoing   3.  The patient will have improved trunk flexor and extensor muscle strength to at least 4-/5 needed for  rising from the couch  Baseline:  Goal status: ongoing   4.  The patient will be able to walk for 3-5 minutes (with crutch if needed) Baseline:  Goal status: ongoing     LONG TERM GOALS: Target date: 02/17/2023     The patient will be independent in a safe self progression of a home exercise program to promote further recovery of function   Baseline:  Goal status: INITIAL   2.  The  patient will report a 60% improvement in pain levels with functional activities which are currently difficult including sitting, rising and bending Baseline:  Goal status: INITIAL   3.  The patient will have improved trunk flexor and extensor muscle strength to at least 4+/5 needed for lifting light weight objects such as grocery bags  Baseline:  Goal status: INITIAL   4.  The patient will have improved gait stamina and speed needed to ambulate 600 feet in 6 minutes  Baseline:  Goal status: INITIAL   5.  Improved Oswestry score to 49% indicating improved function with less pain Baseline:  Goal status: INITIAL       PLAN:   PT FREQUENCY: 2x/week   PT DURATION: 8 weeks   PLANNED INTERVENTIONS: Therapeutic exercises, Therapeutic activity, Neuromuscular re-education, Gait training, Patient/Family education, Self Care, Joint mobilization, Aquatic Therapy, Dry Needling, Electrical stimulation, Cryotherapy, Moist heat, Taping, Traction, Ultrasound, Manual therapy, and Re-evaluation.   PLAN FOR NEXT SESSION: graded exposure to exercise;  Estim for pain control consider walk test to see how far pt can walk for  STG 4.     Ruben Im, PT 01/07/23 10:11 AM Phone: [phone] Fax: [phone]   Phone: [phone] Fax: [phone]

## 2023-01-12 ENCOUNTER — Encounter: Payer: Self-pay | Admitting: Physical Therapy

## 2023-01-12 ENCOUNTER — Ambulatory Visit: Payer: 59 | Admitting: Physical Therapy

## 2023-01-12 DIAGNOSIS — M5459 Other low back pain: Secondary | ICD-10-CM | POA: Diagnosis not present

## 2023-01-12 DIAGNOSIS — M6281 Muscle weakness (generalized): Secondary | ICD-10-CM

## 2023-01-12 NOTE — Therapy (Signed)
OUTPATIENT PHYSICAL THERAPY TREATMENT NOTE   Patient Name: Anthony Chan MRN: [id number] DOB:Apr 23, 1966, 57 y.o., male Today's Date: 01/12/2023  PCP: None REFERRING PROVIDER: Micheline Maze PA-C   END OF SESSION:   PT End of Session - 01/12/23 1059     Visit Number 7    Date for PT Re-Evaluation 02/17/23    Authorization Type [employer]    PT Start Time 1100    PT Stop Time 1200    PT Time Calculation (min) 60 min    Activity Tolerance Patient tolerated treatment well    Behavior During Therapy WFL for tasks assessed/performed               Past Medical History:  Diagnosis Date   Allergy    History reviewed. No pertinent surgical history. There are no problems to display for this patient.   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 Anthony Chan or first name pronounced like Anthony Chan' Seeking disability and may benefit from an FCE after course of PT  PRECAUTIONS: None  SUBJECTIVE:                                                                                                                                                                                      SUBJECTIVE STATEMENT:  I went back home this weekend and that was pretty challenging sitting for so long in the car.    PAIN:  Are you having pain? Yes: NPRS scale: 7-8/10 Pain location: RT lumbar and hip Pain description: Sore Aggravating factors: Walking Relieving factors: Laying on LT side better   OBJECTIVE: (objective measures completed at initial evaluation unless otherwise dated)  IAGNOSTIC FINDINGS:  IMPRESSION: 1. 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 Associated bilateral L5-S1 transitional assimilation joints  with near-complete right-sided fusion and anterior left-sided fusion and associated mild osteoarthritis. These can be a source of lower back pain and/or hip pain. 2. No acute fracture of the lumbar spine. 3. Mild-to-moderate degenerative disc changes at L4-5 with mild retrolisthesis and a broad-based posterior disc osteophyte complex. Disc minimally impresses on the descending right L5 nerve within the right lateral recess. 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     PATIENT SURVEYS:  Modified Oswestry 58% self perceived at severe disability    COGNITION: Overall cognitive status: Within functional limits for tasks assessed  SENSATION: Light touch: Impaired right LE     LUMBAR ROM:  Declines squat to reach to the floor secondary to fear of pain   AROM eval  Flexion Hands to knees only very painful  Extension 10  Right lateral flexion 20 painful  Left lateral flexion 25  Right rotation    Left rotation     (Blank rows = not tested)   TRUNK STRENGTH:  Decreased activation of transverse abdominus muscles; abdominals 4-/5; decreased activation of lumbar multifidi; trunk extensors 4-/5   LOWER EXTREMITY ROM:   painful with SKTC   LOWER EXTREMITY MMT:   Fearful with attempting bil heel raise (more weight on left) LUMBAR SPECIAL TESTS:  +slump on right    FUNCTIONAL TESTS:  Unable to rise from a very high mat table without UE assist (shifted left)  catch in back with rising; Able to roll to left side and then sidelying to sitting with low levels of pain GAIT: Assistive device utilized: Crutches one Comments: using crutch on right side (slow/guarded   TODAY'S TREATMENT:   01/12/23: Nustep L1 x  8 min, with PTA present to discuss current status  Supine green band clamshells: Bil 10x, RTLE  10x Sidelying green band clam 10x right Sidelying open books 10x right/left Supine hand to opp knee push to activate abdominals 5x. This was  difficult for pt to pick his leg up. Supine bridge 10x Pt took 4 min to walk from back low mat down the hall around loop around cancer rehab and back to low mat. Pt used crutch for ambulation.  IFC 12 ma  in LT sidelying 10 min to RT lumbar, pillows bt legs MHP to RT hip  01/07/23: Nustep L1 x 3 min, L3 4 min with PT present to discuss current status , with UE use to activate core muscle use  Green band supine clamshell 5x Bil, then 5x single Supine hand to opp knee push to activate abdominals 5x Supine bridge 5x, green band across hips 5x Sidelying open books 5x right/left Sidelying green band clam 5x right/left Seated with green band under feet with cue to sit up tall (modified dead lift) 10x2 IFC 12 ma  in LT sidelying 10 min to RT lumbar, pillows bt legs MHP to RT hip  01/04/23: Nustep L1 x 8 min with PTA present to discuss current status and pt requested he watch the end of a training video he was watching.  Green band supine clamshell 10x Bil, then 10x single LTR to the LT 10x Supine bridge 10x Supine RTLE march/hip flexion 10x  with VC to engage core/lower abs more Seated with red band under feet with cue to sit up tall (modified dead lift) 10x2 Sit to stand 3x from low mat table IFC 20v in LT sidelying 12 min to RT lumbar, pillows bt legs MHP to RT hip   PATIENT EDUCATION:  Education details: Educated patient on anatomy and physiology of current symptoms, prognosis, plan of care as well as initial self care strategies to promote recovery  Person educated: Patient Education method: Explanation Education comprehension: verbalized understanding   HOME EXERCISE PROGRAM: Access Code: [email or web address] URL: [email or web address] Date: 12/29/2022 Prepared by: Anthony Chan  Exercises - Supine Lower Trunk Rotation  - 2 x daily - 7 x weekly - 3 reps - Supine Single Knee to Chest Stretch  - 2 x daily - 7 x weekly - 3 reps - Supine Diaphragmatic Breathing  - 3 x daily - 7 x  weekly -  60 hold - Supine Transversus Abdominis Bracing - Hands on Stomach  - 2 x daily - 7 x weekly - 10 reps   ASSESSMENT:   CLINICAL IMPRESSION: Pt arrives today with a lot of hip and back stiffness which he attributes to rising for almost 2 hrs both ways as he went "home to visit." Pt definitely moving slow and stiff. Pt reporting he is having an MRI around the 27th of this month. Pt walked 4 min without a rest break: walking from the back low mat and around [facility]. Pt reported this did not increase his pain at all.        OBJECTIVE IMPAIRMENTS: decreased activity tolerance, decreased mobility, difficulty walking, decreased ROM, decreased strength, impaired perceived functional ability, and pain.    ACTIVITY LIMITATIONS: carrying, lifting, bending, sitting, standing, squatting, sleeping, bed mobility, bathing, toileting, dressing, hygiene/grooming, and locomotion level   PARTICIPATION LIMITATIONS: meal prep, cleaning, laundry, shopping, community activity, and occupation   PERSONAL FACTORS: Past/current experiences and Time since onset of injury/illness/exacerbation are also affecting patient's functional outcome.    REHAB POTENTIAL: Good   CLINICAL DECISION MAKING: moderate  EVALUATION COMPLEXITY: Moderate     GOALS: Goals reviewed with patient? Yes   SHORT TERM GOALS: Target date: 01/20/2023     The patient will demonstrate knowledge of basic self care strategies and exercises to promote healing   Baseline: Goal status: ongoing   2.  The patient will report a 30% improvement in pain levels with functional activities which are currently difficult including sitting, bending Baseline:  Goal status: ongoing   3.  The patient will have improved trunk flexor and extensor muscle strength to at least 4-/5 needed for  rising from the couch  Baseline:  Goal status: ongoing   4.  The patient will be able to walk for 3-5 minutes (with crutch if needed) Baseline:  Goal  status: Goal met 01/12/23   LONG TERM GOALS: Target date: 02/17/2023     The patient will be independent in a safe self progression of a home exercise program to promote further recovery of function   Baseline:  Goal status: INITIAL   2.  The patient will report a 60% improvement in pain levels with functional activities which are currently difficult including sitting, rising and bending Baseline:  Goal status: INITIAL   3.  The patient will have improved trunk flexor and extensor muscle strength to at least 4+/5 needed for lifting light weight objects such as grocery bags  Baseline:  Goal status: INITIAL   4.  The patient will have improved gait stamina and speed needed to ambulate 600 feet in 6 minutes  Baseline:  Goal status: INITIAL   5.  Improved Oswestry score to 49% indicating improved function with less pain Baseline:  Goal status: INITIAL       PLAN:   PT FREQUENCY: 2x/week   PT DURATION: 8 weeks   PLANNED INTERVENTIONS: Therapeutic exercises, Therapeutic activity, Neuromuscular re-education, Gait training, Patient/Family education, Self Care, Joint mobilization, Aquatic Therapy, Dry Needling, Electrical stimulation, Cryotherapy, Moist heat, Taping, Traction, Ultrasound, Manual therapy, and Re-evaluation.   PLAN FOR NEXT SESSION: graded exposure to exercise;  Estim for pain control    Anthony Chan, PTA 01/12/23 11:48 AM  Phone: [phone] Fax: [phone]

## 2023-01-14 ENCOUNTER — Ambulatory Visit: Payer: 59 | Admitting: Physical Therapy

## 2023-01-14 DIAGNOSIS — M5459 Other low back pain: Secondary | ICD-10-CM | POA: Diagnosis not present

## 2023-01-14 DIAGNOSIS — M6281 Muscle weakness (generalized): Secondary | ICD-10-CM

## 2023-01-14 NOTE — Therapy (Signed)
OUTPATIENT PHYSICAL THERAPY TREATMENT NOTE   Patient Name: Anthony Chan MRN: [id number] DOB:13-Feb-1966, 57 y.o., male Today's Date: 01/14/2023  PCP: None REFERRING PROVIDER: Micheline Maze PA-C   END OF SESSION:   PT End of Session - 01/14/23 1235     Visit Number 8    Date for PT Re-Evaluation 02/17/23    Authorization Type [employer]    PT Start Time 1232    PT Stop Time 1312    PT Time Calculation (min) 40 min    Activity Tolerance Patient tolerated treatment well               Past Medical History:  Diagnosis Date   Allergy    No past surgical history on file. There are no problems to display for this patient.   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  My sister gave me some lidocaine patches.  I slept good last night.   PAIN:  Are you having pain? Yes: NPRS scale: 2-3/10 Pain location: RT lumbar and hip Pain description: Sore Aggravating factors: Walking Relieving factors: Laying on LT side better   OBJECTIVE: (objective measures completed at initial evaluation unless otherwise dated)  IAGNOSTIC FINDINGS:  IMPRESSION: 1. 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 Associated bilateral L5-S1 transitional assimilation joints with near-complete right-sided fusion and anterior left-sided fusion  and associated mild osteoarthritis. These can be a source of lower back pain and/or hip pain. 2. No acute fracture of the lumbar spine. 3. Mild-to-moderate degenerative disc changes at L4-5 with mild retrolisthesis and a broad-based posterior disc osteophyte complex. Disc minimally impresses on the descending right L5 nerve within the right lateral recess. 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     PATIENT SURVEYS:  Modified Oswestry 58% self perceived at severe disability    COGNITION: Overall cognitive status: Within functional limits for tasks assessed                          SENSATION:  Light touch: Impaired right LE     LUMBAR ROM:  Declines squat to reach to the floor secondary to fear of pain   AROM eval  Flexion Hands to knees only very painful  Extension 10  Right lateral flexion 20 painful  Left lateral flexion 25  Right rotation    Left rotation     (Blank rows = not tested)   TRUNK STRENGTH:  Decreased activation of transverse abdominus muscles; abdominals 4-/5; decreased activation of lumbar multifidi; trunk extensors 4-/5   LOWER EXTREMITY ROM:   painful with SKTC   LOWER EXTREMITY MMT:   Fearful with attempting bil heel raise (more weight on left) LUMBAR SPECIAL TESTS:  +slump on right    FUNCTIONAL TESTS:  Unable to rise from a very high mat table without UE assist (shifted left)  catch in back with rising; Able to roll to left side and then sidelying to sitting with low levels of pain GAIT: Assistive device utilized: Crutches one Comments: using crutch on right side (slow/guarded   TODAY'S TREATMENT:  01/14/23: Nustep L2 x  10 min, with PT present to discuss current status  Supine bridges 10x  Sidelying green band clam 10x right Sidelying open books 10x right/left (left sidelying, right rotation more difficult) Seated with green band under feet with cue to sit up tall (modified dead lift) 10x2 Nu-step chair with cushion (hips higher  than knees) sit to stand 5x IFC 12 ma  in LT sidelying 10 min to RT lumbar, pillows bt legs MHP to RT hip  01/12/23: Nustep L1 x  8 min, with PTA present to discuss current status  Supine green band clamshells: Bil 10x, RTLE  10x Sidelying green band clam 10x right Sidelying open books 10x right/left Supine hand to opp knee push to activate abdominals 5x. This was difficult for pt to pick his leg up. Supine bridge 10x Pt took 4 min to walk from back low mat down the hall around loop around [facility] rehab and back to low mat. Pt used crutch for ambulation.  IFC 12 ma  in LT sidelying 10 min to RT lumbar, pillows bt legs MHP to RT hip  01/07/23: Nustep L1 x 3 min, L3 4 min with PT present to discuss current status , with UE use to activate core muscle use  Green band supine clamshell 5x Bil, then 5x single Supine hand to opp knee push to activate abdominals 5x Supine bridge 5x, green band across hips 5x Sidelying open books 5x right/left Sidelying green band clam 5x right/left Seated with green band under feet with cue to sit up tall (modified dead lift) 10x2 IFC 12 ma  in LT sidelying 10 min to RT lumbar, pillows bt legs MHP to RT hip   PATIENT EDUCATION:  Education details: Educated patient on anatomy and physiology of current symptoms, prognosis, plan of care as well as initial self care strategies to promote recovery  Person educated: Patient Education method: Explanation Education comprehension: verbalized understanding   HOME EXERCISE PROGRAM: Access Code: [email or web address] URL: [email or web address] Date: 12/29/2022 Prepared by: Myrene Galas  Exercises - Supine Lower Trunk Rotation  - 2 x daily - 7 x weekly - 3 reps - Supine Single Knee to Chest Stretch  - 2 x daily - 7 x weekly - 3 reps - Supine Diaphragmatic Breathing  - 3 x daily - 7 x weekly - 60 hold - Supine Transversus Abdominis Bracing - Hands on Stomach  - 2 x daily - 7  x weekly - 10 reps   ASSESSMENT:    CLINICAL IMPRESSION:  Continued progression of exercise intensity a little easier today secondary to lower pain rating.  Right LE motor control limited with frequent grimacing and holding breath during ex.  He states it is from effort and not b/c of increased pain.  He reports excellent pain control from ES/heat.  Therapist monitoring response to all interventions and modifying treatment accordingly.       OBJECTIVE IMPAIRMENTS: decreased activity tolerance, decreased mobility, difficulty walking, decreased ROM, decreased strength, impaired perceived functional ability, and pain.    ACTIVITY LIMITATIONS: carrying, lifting, bending, sitting, standing, squatting, sleeping, bed mobility, bathing, toileting, dressing, hygiene/grooming, and locomotion level   PARTICIPATION LIMITATIONS: meal prep, cleaning, laundry, shopping, community activity, and occupation   PERSONAL FACTORS: Past/current experiences and Time since onset of injury/illness/exacerbation are also affecting patient's functional outcome.    REHAB POTENTIAL: Good   CLINICAL DECISION MAKING: moderate  EVALUATION COMPLEXITY: Moderate     GOALS: Goals reviewed with patient? Yes   SHORT TERM GOALS: Target date: 01/20/2023     The patient will demonstrate knowledge of basic self care strategies and exercises to promote healing   Baseline: Goal status: ongoing   2.  The patient will report a 30% improvement in pain levels with functional activities which are currently difficult including sitting, bending Baseline:  Goal status: ongoing   3.  The patient will have improved trunk flexor and extensor muscle strength to at least 4-/5 needed for  rising from the couch  Baseline:  Goal status: ongoing   4.  The patient will be able to walk for 3-5 minutes (with crutch if needed) Baseline:  Goal status: Goal met 01/12/23   LONG TERM GOALS: Target date: 02/17/2023     The patient will be independent in a safe self progression  of a home exercise program to promote further recovery of function   Baseline:  Goal status: INITIAL   2.  The patient will report a 60% improvement in pain levels with functional activities which are currently difficult including sitting, rising and bending Baseline:  Goal status: INITIAL   3.  The patient will have improved trunk flexor and extensor muscle strength to at least 4+/5 needed for lifting light weight objects such as grocery bags  Baseline:  Goal status: INITIAL   4.  The patient will have improved gait stamina and speed needed to ambulate 600 feet in 6 minutes  Baseline:  Goal status: INITIAL   5.  Improved Oswestry score to 49% indicating improved function with less pain Baseline:  Goal status: INITIAL       PLAN:   PT FREQUENCY: 2x/week   PT DURATION: 8 weeks   PLANNED INTERVENTIONS: Therapeutic exercises, Therapeutic activity, Neuromuscular re-education, Gait training, Patient/Family education, Self Care, Joint mobilization, Aquatic Therapy, Dry Needling, Electrical stimulation, Cryotherapy, Moist heat, Taping, Traction, Ultrasound, Manual therapy, and Re-evaluation.   PLAN FOR NEXT SESSION: graded exposure to exercise; right LE progressive strengthening;  Estim for pain control  Ruben Im, PT 01/14/23 1:11 PM Phone: [phone] Fax: [phone]

## 2023-01-20 ENCOUNTER — Ambulatory Visit: Payer: 59 | Admitting: Physical Therapy

## 2023-01-20 DIAGNOSIS — M5459 Other low back pain: Secondary | ICD-10-CM | POA: Diagnosis not present

## 2023-01-20 DIAGNOSIS — M6281 Muscle weakness (generalized): Secondary | ICD-10-CM

## 2023-01-20 NOTE — Therapy (Addendum)
OUTPATIENT PHYSICAL THERAPY TREATMENT NOTE/DISCHARGE SUMMARY   Patient Name: Anthony Chan MRN: [id number] DOB:[birth year], 57 y.o., male Today's Date: 01/20/2023  PCP: None REFERRING PROVIDER: Micheline Maze PA-C   END OF SESSION:   PT End of Session - 01/20/23 1105     Visit Number 9    Date for PT Re-Evaluation 02/17/23    Authorization Type [employer]    PT Start Time 1101    PT Stop Time 1144    PT Time Calculation (min) 43 min    Activity Tolerance Patient tolerated treatment well               Past Medical History:  Diagnosis Date   Allergy    No past surgical history on file. There are no problems to display for this patient.   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 or first name pronounced like Venora Maples' Seeking disability and may benefit from an FCE after course of PT  PRECAUTIONS: None  SUBJECTIVE:                                                                                                                                                                                      SUBJECTIVE STATEMENT:  I was doing good until it got cold again.  The TENS feels good for a while.   PAIN:  Are you having pain? PAIN:  Are you having pain? Yes NPRS scale: 5-6/10 Pain location: right side of low back   OBJECTIVE: (objective measures completed at initial evaluation unless otherwise dated)  IAGNOSTIC FINDINGS:  IMPRESSION: 1. 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 Associated bilateral L5-S1 transitional assimilation joints with near-complete right-sided fusion and anterior left-sided fusion and associated mild osteoarthritis. These can be a source of lower back pain and/or  hip pain. 2. No acute fracture of the lumbar spine. 3. Mild-to-moderate degenerative disc changes at L4-5 with mild retrolisthesis and a broad-based posterior disc osteophyte complex. Disc minimally impresses on the descending right L5 nerve within the right lateral recess. 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     PATIENT SURVEYS:  Modified Oswestry 58% self perceived at severe disability    COGNITION: Overall cognitive status: Within functional limits for tasks assessed                          SENSATION: Light touch: Impaired right LE  LUMBAR ROM:  Declines squat to reach to the floor secondary to fear of pain   AROM eval 2/20  Flexion Hands to knees only very painful Just below knees  Extension 10 15  Right lateral flexion 20 painful 25  Left lateral flexion 25 25  Right rotation     Left rotation      (Blank rows = not tested)   TRUNK STRENGTH:  Decreased activation of transverse abdominus muscles; abdominals 4-/5; decreased activation of lumbar multifidi; trunk extensors 4-/5   LOWER EXTREMITY ROM:   painful with SKTC   LOWER EXTREMITY MMT:   Fearful with attempting bil heel raise (more weight on left) LUMBAR SPECIAL TESTS:  +slump on right    FUNCTIONAL TESTS:  Unable to rise from a very high mat table without UE assist (shifted left)  catch in back with rising; Able to roll to left side and then sidelying to sitting with low levels of pain GAIT: Assistive device utilized: Crutches one Comments: using crutch on right side (slow/guarded   TODAY'S TREATMENT:  2/20: Bike 7 min L1 Seated trunk stretch ball rollouts 8x  Sit to stands mat + 2 cushions 2x5  Seated medium pink loop dead lifts (sit talls) 10x Seated in chair + cushion 20# rows 22x 2nd step right hip flexor stretch with UE overhead reach 5x right/left Standing lat bar 40# 20x IFC 12 ma  in LT sidelying 10 min to RT lumbar, pillows bt legs MHP to RT hip      01/14/23: Nustep  L2 x  10 min, with PT present to discuss current status  Supine bridges 10x  Sidelying green band clam 10x right Sidelying open books 10x right/left (left sidelying, right rotation more difficult) Seated with green band under feet with cue to sit up tall (modified dead lift) 10x2 Nu-step chair with cushion (hips higher than knees) sit to stand 5x IFC 12 ma  in LT sidelying 10 min to RT lumbar, pillows bt legs MHP to RT hip  01/12/23: Nustep L1 x  8 min, with PTA present to discuss current status  Supine green band clamshells: Bil 10x, RTLE  10x Sidelying green band clam 10x right Sidelying open books 10x right/left Supine hand to opp knee push to activate abdominals 5x. This was difficult for pt to pick his leg up. Supine bridge 10x Pt took 4 min to walk from back low mat down the hall around loop around cancer rehab and back to low mat. Pt used crutch for ambulation.  IFC 12 ma  in LT sidelying 10 min to RT lumbar, pillows bt legs MHP to RT hip   PATIENT EDUCATION:  Education details: Educated patient on anatomy and physiology of current symptoms, prognosis, plan of care as well as initial self care strategies to promote recovery  Person educated: Patient Education method: Explanation Education comprehension: verbalized understanding   HOME EXERCISE PROGRAM: Access Code: [email or web address] URL: [email or web address] Date: 12/29/2022 Prepared by: Myrene Galas  Exercises - Supine Lower Trunk Rotation  - 2 x daily - 7 x weekly - 3 reps - Supine Single Knee to Chest Stretch  - 2 x daily - 7 x weekly - 3 reps - Supine Diaphragmatic Breathing  - 3 x daily - 7 x weekly - 60 hold - Supine Transversus Abdominis Bracing - Hands on Stomach  - 2 x daily - 7 x weekly - 10 reps   ASSESSMENT:   CLINICAL IMPRESSION:  Able to progress exercise from unloaded/lying down to  more upright in seated and standing today.  Pain remains the same intensity at start of session and following ex.   Dependent on crutch secondary to pt fearfulness of right LE give-way.   Pain relief achieved with ES.  Therapist monitoring response throughout treatment session.      OBJECTIVE IMPAIRMENTS: decreased activity tolerance, decreased mobility, difficulty walking, decreased ROM, decreased strength, impaired perceived functional ability, and pain.    ACTIVITY LIMITATIONS: carrying, lifting, bending, sitting, standing, squatting, sleeping, bed mobility, bathing, toileting, dressing, hygiene/grooming, and locomotion level   PARTICIPATION LIMITATIONS: meal prep, cleaning, laundry, shopping, community activity, and occupation   PERSONAL FACTORS: Past/current experiences and Time since onset of injury/illness/exacerbation are also affecting patient's functional outcome.    REHAB POTENTIAL: Good   CLINICAL DECISION MAKING: moderate  EVALUATION COMPLEXITY: Moderate     GOALS: Goals reviewed with patient? Yes   SHORT TERM GOALS: Target date: 01/20/2023     The patient will demonstrate knowledge of basic self care strategies and exercises to promote healing   Baseline: Goal status: met 2/20   2.  The patient will report a 30% improvement in pain levels with functional activities which are currently difficult including sitting, bending Baseline:  Goal status: ongoing   3.  The patient will have improved trunk flexor and extensor muscle strength to at least 4-/5 needed for  rising from the couch  Baseline:  Goal status: met 2/20   4.  The patient will be able to walk for 3-5 minutes (with crutch if needed) Baseline:  Goal status: Goal met 01/12/23   LONG TERM GOALS: Target date: 02/17/2023     The patient will be independent in a safe self progression of a home exercise program to promote further recovery of function   Baseline:  Goal status: INITIAL   2.  The patient will report a 60% improvement in pain levels with functional activities which are currently difficult including sitting,  rising and bending Baseline:  Goal status: INITIAL   3.  The patient will have improved trunk flexor and extensor muscle strength to at least 4+/5 needed for lifting light weight objects such as grocery bags  Baseline:  Goal status: INITIAL   4.  The patient will have improved gait stamina and speed needed to ambulate 600 feet in 6 minutes  Baseline:  Goal status: INITIAL   5.  Improved Oswestry score to 49% indicating improved function with less pain Baseline:  Goal status: INITIAL       PLAN:   PT FREQUENCY: 2x/week   PT DURATION: 8 weeks   PLANNED INTERVENTIONS: Therapeutic exercises, Therapeutic activity, Neuromuscular re-education, Gait training, Patient/Family education, Self Care, Joint mobilization, Aquatic Therapy, Dry Needling, Electrical stimulation, Cryotherapy, Moist heat, Taping, Traction, Ultrasound, Manual therapy, and Re-evaluation.   PLAN FOR NEXT SESSION: check % improvement for STG;  check FOTO;  graded exposure to exercise; right LE progressive strengthening;  try leg press;  Estim for pain control  Ruben Im, PT 01/20/23 5:09 PM Phone: [phone] Fax: [phone]    PHYSICAL THERAPY DISCHARGE SUMMARY  Visits from Start of Care: 9  Current functional level related to goals / functional outcomes: The patient no-showed for 3 appointments in a row.  When called , the patient reported conflicting appointments with other medical providers and transportation issues.  He states he has an upcoming ESI.  Recommended discharge from PT at this time due to our attendance policy.  If patient needs PT following injection a new referral would  be necessary.     Remaining deficits: As above   Education / Equipment: HEP   Patient agrees to discharge. Patient goals were partially met. Patient is being discharged due to not returning since the last visit.   Ruben Im, PT 02/04/23 9:04 AM Phone: [phone] Fax: [phone]

## 2023-01-22 ENCOUNTER — Telehealth: Payer: Self-pay | Admitting: Physical Therapy

## 2023-01-22 ENCOUNTER — Ambulatory Visit: Payer: 59 | Admitting: Physical Therapy

## 2023-01-22 NOTE — Telephone Encounter (Signed)
Called and spoke with patient regarding missed appt.   States he couldn't make it today.  Reminded of next appt and he agreed to call if he was unable to come.

## 2023-01-22 NOTE — Telephone Encounter (Signed)
Called and spoke with pt regarding missed appt

## 2023-01-27 ENCOUNTER — Telehealth: Payer: Self-pay | Admitting: Physical Therapy

## 2023-01-27 ENCOUNTER — Ambulatory Visit: Payer: 59 | Admitting: Physical Therapy

## 2023-01-27 NOTE — Telephone Encounter (Signed)
Called pt regarding missed appt today.  He states he is on his way to another appt.  Reminded him of next appt and to call to cancel if he has a conflict.

## 2023-02-03 ENCOUNTER — Ambulatory Visit: Payer: Medicaid Other | Admitting: Physical Therapy

## 2023-02-03 ENCOUNTER — Telehealth: Payer: Self-pay | Admitting: Physical Therapy

## 2023-02-03 NOTE — Telephone Encounter (Signed)
Called patient regarding 3rd no-show in a row.  He was waiting for his ride to come this morning.  He states he has an upcoming ESI on 3/11.  We agreed to put PT on hold for now and will cancel his remaining appts.

## 2023-02-05 ENCOUNTER — Encounter: Payer: Medicaid Other | Admitting: Physical Therapy

## 2023-02-10 ENCOUNTER — Encounter: Payer: Medicaid Other | Admitting: Physical Therapy

## 2023-02-12 ENCOUNTER — Encounter: Payer: Medicaid Other | Admitting: Physical Therapy

## 2023-02-17 ENCOUNTER — Ambulatory Visit: Payer: Medicaid Other | Admitting: Physical Therapy

## 2023-07-15 DIAGNOSIS — M5416 Radiculopathy, lumbar region: Secondary | ICD-10-CM | POA: Diagnosis not present

## 2023-07-15 DIAGNOSIS — M6289 Other specified disorders of muscle: Secondary | ICD-10-CM | POA: Diagnosis not present

## 2023-07-15 DIAGNOSIS — M461 Sacroiliitis, not elsewhere classified: Secondary | ICD-10-CM | POA: Diagnosis not present

## 2023-08-20 DIAGNOSIS — M5441 Lumbago with sciatica, right side: Secondary | ICD-10-CM | POA: Diagnosis not present

## 2023-08-20 DIAGNOSIS — Z0000 Encounter for general adult medical examination without abnormal findings: Secondary | ICD-10-CM | POA: Diagnosis not present

## 2023-08-20 DIAGNOSIS — Z5986 Financial insecurity: Secondary | ICD-10-CM | POA: Diagnosis not present

## 2023-08-20 DIAGNOSIS — Z1211 Encounter for screening for malignant neoplasm of colon: Secondary | ICD-10-CM | POA: Diagnosis not present

## 2023-08-20 DIAGNOSIS — G8929 Other chronic pain: Secondary | ICD-10-CM | POA: Diagnosis not present

## 2023-08-20 DIAGNOSIS — Z13220 Encounter for screening for lipoid disorders: Secondary | ICD-10-CM | POA: Diagnosis not present

## 2023-08-20 DIAGNOSIS — E669 Obesity, unspecified: Secondary | ICD-10-CM | POA: Diagnosis not present
